# Patient Record
Sex: Male | Born: 1980 | Race: Black or African American | Hispanic: No | Marital: Single | Smoking: Current every day smoker
Health system: Southern US, Community
[De-identification: ages and names within clinical notes are randomized; demographics above are authoritative.]

---

## 2003-09-17 ENCOUNTER — Emergency Department (HOSPITAL_COMMUNITY): Admission: EM | Admit: 2003-09-17 | Discharge: 2003-09-17 | Payer: Self-pay | Admitting: *Deleted

## 2003-11-11 ENCOUNTER — Emergency Department (HOSPITAL_COMMUNITY): Admission: EM | Admit: 2003-11-11 | Discharge: 2003-11-11 | Payer: Self-pay | Admitting: *Deleted

## 2004-08-31 ENCOUNTER — Emergency Department (HOSPITAL_COMMUNITY): Admission: EM | Admit: 2004-08-31 | Discharge: 2004-08-31 | Payer: Self-pay | Admitting: Emergency Medicine

## 2004-09-14 ENCOUNTER — Emergency Department (HOSPITAL_COMMUNITY): Admission: EM | Admit: 2004-09-14 | Discharge: 2004-09-14 | Payer: Self-pay | Admitting: Emergency Medicine

## 2004-12-01 ENCOUNTER — Emergency Department (HOSPITAL_COMMUNITY): Admission: EM | Admit: 2004-12-01 | Discharge: 2004-12-01 | Payer: Self-pay | Admitting: Emergency Medicine

## 2005-02-11 ENCOUNTER — Emergency Department (HOSPITAL_COMMUNITY): Admission: EM | Admit: 2005-02-11 | Discharge: 2005-02-11 | Payer: Self-pay | Admitting: Emergency Medicine

## 2005-02-16 ENCOUNTER — Emergency Department (HOSPITAL_COMMUNITY): Admission: EM | Admit: 2005-02-16 | Discharge: 2005-02-16 | Payer: Self-pay | Admitting: Emergency Medicine

## 2011-12-28 ENCOUNTER — Encounter (HOSPITAL_COMMUNITY): Payer: Self-pay | Admitting: *Deleted

## 2011-12-28 ENCOUNTER — Emergency Department (HOSPITAL_COMMUNITY)
Admission: EM | Admit: 2011-12-28 | Discharge: 2011-12-28 | Disposition: A | Discharge status: Home / Self Care | Payer: Self-pay | Attending: Emergency Medicine | Admitting: Emergency Medicine

## 2011-12-28 DIAGNOSIS — F17200 Nicotine dependence, unspecified, uncomplicated: Secondary | ICD-10-CM | POA: Insufficient documentation

## 2011-12-28 DIAGNOSIS — J019 Acute sinusitis, unspecified: Secondary | ICD-10-CM | POA: Insufficient documentation

## 2011-12-28 MED ORDER — FLUTICASONE PROPIONATE 50 MCG/ACT NA SUSP: 2.0000 | Freq: Every day | NASAL | Status: DC

## 2011-12-28 MED ORDER — AMOXICILLIN-POT CLAVULANATE 875-125 MG PO TABS: 1.0000 | ORAL_TABLET | Freq: Two times a day (BID) | ORAL | Status: AC

## 2011-12-28 NOTE — Discharge Instructions (Signed)
Sinusitis Sinuses are air pockets within the bones of your face. The growth of bacteria within a sinus leads to infection. The infection prevents the sinuses from draining. This infection is called sinusitis. SYMPTOMS  There will be different areas of pain depending on which sinuses have become infected.  The maxillary sinuses often produce pain beneath the eyes.   Frontal sinusitis may cause pain in the middle of the forehead and above the eyes.  Other problems (symptoms) include:  Toothaches.   Colored, pus-like (purulent) drainage from the nose.   Swelling, warmth, and tenderness over the sinus areas may be signs of infection.  TREATMENT  Sinusitis is most often determined by an exam.X-rays may be taken. If x-rays have been taken, make sure you obtain your results or find out how you are to obtain them. Your caregiver may give you medications (antibiotics). These are medications that will help kill the bacteria causing the infection. You may also be given a medication (decongestant) that helps to reduce sinus swelling.  HOME CARE INSTRUCTIONS   Only take over-the-counter or prescription medicines for pain, discomfort, or fever as directed by your caregiver.   Drink extra fluids. Fluids help thin the mucus so your sinuses can drain more easily.   Applying either moist heat or ice packs to the sinus areas may help relieve discomfort.   Use saline nasal sprays to help moisten your sinuses. The sprays can be found at your local drugstore.  SEEK IMMEDIATE MEDICAL CARE IF:  You have a fever.   You have increasing pain, severe headaches, or toothache.   You have nausea, vomiting, or drowsiness.   You develop unusual swelling around the face or trouble seeing.  MAKE SURE YOU:   Understand these instructions.   Will watch your condition.   Will get help right away if you are not doing well or get worse.  Document Released: 09/12/2005 Document Revised: 09/01/2011 Document Reviewed:  04/11/2007 [facility] Patient Information 2012 [place], [place].   Take the meds as directed.  Take tylenol or ibuprofen if needed for pain or fever.

## 2011-12-28 NOTE — ED Provider Notes (Signed)
Medical screening examination/treatment/procedure(s) were performed by non-physician practitioner and as supervising physician I was immediately available for consultation/collaboration.  Hurman Horn, MD 12/28/11 2157

## 2011-12-28 NOTE — ED Provider Notes (Signed)
History     CSN: [id number]  Arrival date & time 12/28/11  2956   First MD Initiated Contact with Patient 12/28/11 [phone]      Chief Complaint  Patient presents with  . Nasal Congestion    (Consider location/radiation/quality/duration/timing/severity/associated sxs/prior treatment) HPI Comments: Nasal congestion chronically but worse in past few days with sinus pressure and thick, gray nasal d/c.  Blood streaks in d/c.  No PCP.  No cough.  No fever.  The history is provided by the patient. No language interpreter was used.    History reviewed. No pertinent past medical history.  History reviewed. No pertinent past surgical history.  No family history on file.  History  Substance Use Topics  . Smoking status: Current Everyday Smoker  . Smokeless tobacco: Not on file  . Alcohol Use: Yes     OCC      Review of Systems  Constitutional: Negative for fever and chills.  HENT: Positive for congestion and sinus pressure.   Respiratory: Negative for cough.   All other systems reviewed and are negative.    Allergies  Review of patient's allergies indicates no known allergies.  Home Medications   Current Outpatient Rx  Name Route Sig Dispense Refill  . AMOXICILLIN-POT CLAVULANATE 875-125 MG PO TABS Oral Take 1 tablet by mouth 2 (two) times daily. 14 tablet 0  . FLUTICASONE PROPIONATE 50 MCG/ACT NA SUSP Nasal Place 2 sprays into the nose daily. 16 g 0    BP 108/71  Pulse 73  Temp(Src) 97.9 F (36.6 C) (Oral)  Resp 16  SpO2 100%  Physical Exam  Nursing note and vitals reviewed. Constitutional: He is oriented to person, place, and time. He appears well-developed and well-nourished.  HENT:  Head: Normocephalic and atraumatic.    Eyes: EOM are normal.  Neck: Normal range of motion.  Cardiovascular: Normal rate, regular rhythm, normal heart sounds and intact distal pulses.   Pulmonary/Chest: Effort normal and breath sounds normal. No accessory muscle usage. Not  tachypneic. No respiratory distress. He has no decreased breath sounds. He has no wheezes. He has no rhonchi. He has no rales.  Abdominal: Soft. He exhibits no distension. There is no tenderness.  Musculoskeletal: Normal range of motion.  Lymphadenopathy:    He has no cervical adenopathy.  Neurological: He is alert and oriented to person, place, and time.  Skin: Skin is warm and dry.  Psychiatric: He has a normal mood and affect. Judgment normal.    ED Course  Procedures (including critical care time)  Labs Reviewed - No data to display No results found.   1. Sinusitis, acute       MDM  rx- augmentin, BID, 14 rx flonase  Nasal spray        Worthy Rancher, PA 12/28/11 0945  Worthy Rancher, PA 12/28/11 [phone]

## 2011-12-28 NOTE — ED Notes (Signed)
"  Sinuses are bothering me, my nose is almost swollen shut and have noticed some bleeding from the nose."

## 2015-02-17 ENCOUNTER — Encounter (HOSPITAL_COMMUNITY): Payer: Self-pay | Admitting: Cardiology

## 2015-02-17 ENCOUNTER — Emergency Department (HOSPITAL_COMMUNITY)
Admission: EM | Admit: 2015-02-17 | Discharge: 2015-02-17 | Disposition: A | Discharge status: Home / Self Care | Payer: Self-pay | Attending: Emergency Medicine | Admitting: Emergency Medicine

## 2015-02-17 DIAGNOSIS — Z7951 Long term (current) use of inhaled steroids: Secondary | ICD-10-CM | POA: Insufficient documentation

## 2015-02-17 DIAGNOSIS — Z720 Tobacco use: Secondary | ICD-10-CM | POA: Insufficient documentation

## 2015-02-17 DIAGNOSIS — K088 Other specified disorders of teeth and supporting structures: Secondary | ICD-10-CM | POA: Insufficient documentation

## 2015-02-17 DIAGNOSIS — K0889 Other specified disorders of teeth and supporting structures: Secondary | ICD-10-CM

## 2015-02-17 MED ORDER — NAPROXEN 500 MG PO TABS: 500.0000 mg | ORAL_TABLET | Freq: Two times a day (BID) | ORAL | Status: DC

## 2015-02-17 MED ORDER — IBUPROFEN 400 MG PO TABS
600.0000 mg | ORAL_TABLET | Freq: Once | ORAL | Status: AC
Start: 2015-02-17 — End: 2015-02-17
  Administered 2015-02-17: 600 mg via ORAL
  Filled 2015-02-17: qty 2

## 2015-02-17 MED ORDER — PENICILLIN V POTASSIUM 500 MG PO TABS: 500.0000 mg | ORAL_TABLET | Freq: Three times a day (TID) | ORAL | Status: DC

## 2015-02-17 NOTE — ED Provider Notes (Signed)
CSN: [id number]     Arrival date & time 02/17/15  8295 History  This chart was scribed for Caleb Ohara, MD by Leona Carry, ED Scribe. The patient was seen in [facility]. The patient's care was started at 9:19 AM.     Chief Complaint  Patient presents with  . Dental Pain   Patient is a 34 y.o. male presenting with tooth pain. The history is provided by the patient. No language interpreter was used.  Dental Pain  HPI Comments: Caleb Rose is a 34 y.o. male who presents to the Emergency Department complaining of intermittent dental pain beginning approximately one month ago. He reports that the pain has become progressively worse in the past 3 days. Patient states that he has experienced pain while eating and reports that his teeth are sensitive to cold. He has taken Flonase for sinus pain but denies taking antibiotics.     History reviewed. No pertinent past medical history. History reviewed. No pertinent past surgical history. History reviewed. No pertinent family history. History  Substance Use Topics  . Smoking status: Current Every Day Smoker  . Smokeless tobacco: Not on file  . Alcohol Use: Yes     Comment: OCC    Review of Systems  HENT: Positive for dental problem.       Allergies  Review of patient's allergies indicates no known allergies.  Home Medications   Prior to Admission medications   Medication Sig Start Date End Date Taking? Authorizing Provider  fluticasone (FLONASE) 50 MCG/ACT nasal spray Place 2 sprays into the nose daily. 12/28/11 12/27/12  Richard Paul Half, PA-C  naproxen (NAPROSYN) 500 MG tablet Take 1 tablet (500 mg total) by mouth 2 (two) times daily. 02/17/15   Caleb Ohara, MD  penicillin v potassium (VEETID) 500 MG tablet Take 1 tablet (500 mg total) by mouth 3 (three) times daily. 02/17/15   Caleb Ohara, MD   Triage Vitals: BP 111/66 mmHg  Pulse 50  Temp(Src) 97.7 F (36.5 C) (Oral)  Resp 16  Ht  (1.803 m)  Wt 150 lb (68.04  kg)  BMI 20.93 kg/m2  SpO2 100% Physical Exam  Constitutional: He is oriented to person, place, and time. He appears well-developed and well-nourished. No distress.  Overall well-appearing.  HENT:  Head: Normocephalic and atraumatic.  Tenderness to posterior aspect of the left lower molar. Previous filling. No external swelling. No abscess appreciated. No trismus.   Eyes: Conjunctivae and EOM are normal.  Neck: Neck supple. No tracheal deviation present.  Cardiovascular: Normal rate.   Pulmonary/Chest: Effort normal. No respiratory distress.  Musculoskeletal: Normal range of motion.  Neurological: He is alert and oriented to person, place, and time.  Skin: Skin is warm and dry.  Psychiatric: He has a normal mood and affect. His behavior is normal.  Nursing note and vitals reviewed.   ED Course  Procedures (including critical care time) DIAGNOSTIC STUDIES: Oxygen Saturation is 100% on room air, normal by my interpretation.    COORDINATION OF CARE:    Labs Review Labs Reviewed - No data to display  Imaging Review No results found.   EKG Interpretation None      MDM   Final diagnoses:  Pain, dental   Well appearing, no red flags.  Results and differential diagnosis were discussed with the patient/parent/guardian. Close follow up outpatient was discussed, comfortable with the plan.   Medications  ibuprofen (ADVIL,MOTRIN) tablet 600 mg (not administered)    Filed Vitals:   02/17/15  0758 02/17/15 0802  BP: 111/66   Pulse: 50   Temp: 97.7 F (36.5 C)   TempSrc: Oral   Resp: 16   Height: 5\' 11"  (1.803 m)   Weight: 150 lb (68.04 kg)   SpO2: 100% 100%    Final diagnoses:  Pain, dental      Caleb OharaJoshua Emari Demmer, MD 02/17/15 [phone]

## 2015-02-17 NOTE — ED Notes (Signed)
Dental pain times one month.  Worse today.

## 2015-02-17 NOTE — Discharge Instructions (Signed)
Take antibiotics as directed, please see a dentist.  Take ibuprofen every 6 hrs and tylenol every 4 hrs for pain.  If you were given medicines take as directed.  If you are on coumadin or contraceptives realize their levels and effectiveness is altered by many different medicines.  If you have any reaction (rash, tongues swelling, other) to the medicines stop taking and see a physician.    If your blood pressure was elevated in the ER make sure you follow up for management with a primary doctor or return for chest pain, shortness of breath or stroke symptoms.  Please follow up as directed and return to the ER or see a physician for new or worsening symptoms.  Thank you. Filed Vitals:   02/17/15 0758 02/17/15 0802  BP: 111/66   Pulse: 50   Temp: 97.7 F (36.5 C)   TempSrc: Oral   Resp: 16   Height:  (1.803 m)   Weight: 150 lb (68.04 kg)   SpO2: 100% 100%    Emergency Department Resource Guide 1) Find a Doctor and Pay Out of Pocket Although you won't have to find out who is covered by your insurance plan, it is a good idea to ask around and get recommendations. You will then need to call the office and see if the doctor you have chosen will accept you as a new patient and what types of options they offer for patients who are self-pay. Some doctors offer discounts or will set up payment plans for their patients who do not have insurance, but you will need to ask so you aren't surprised when you get to your appointment.  2) Contact Your Local Health Department Not all health departments have doctors that can see patients for sick visits, but many do, so it is worth a call to see if yours does. If you don't know where your local health department is, you can check in your phone book. The CDC also has a tool to help you locate your state's health department, and many state websites also have listings of all of their local health departments.  3) Find a Walk-in Clinic If your illness is  not likely to be very severe or complicated, you may want to try a walk in clinic. These are popping up all over the country in pharmacies, drugstores, and shopping centers. They're usually staffed by nurse practitioners or physician assistants that have been trained to treat common illnesses and complaints. They're usually fairly quick and inexpensive. However, if you have serious medical issues or chronic medical problems, these are probably not your best option.  No Primary Care Doctor: - Call Health Connect at  [phone] - they can help you locate a primary care doctor that  accepts your insurance, provides certain services, etc. - Physician Referral Service- [phone]  Chronic Pain Problems: Organization         Address  Phone   Notes  [facility]  [phone] Patients need to be referred by their primary care doctor.   Medication Assistance: Organization         Address  Phone   Notes  [facility] Medication [facility] [address] [place], [place] [address] [phone] --Must be a resident of [facility] -- Must have NO insurance coverage whatsoever (no Medicaid/ Medicare, etc.) -- The pt. MUST have a primary care doctor that directs their care regularly and follows them in the community   MedAssist  (  [phone]   [employer]  [phone]    Agencies that provide inexpensive medical care: Organization         Address  Phone   Notes  [facility]  [phone]   [facility]    [phone]   [facility] [address] [place], [place] [address] [phone]   [facility] [address] [place] [place] [phone]   Planned Parenthood    [phone]   [facility]    [phone]   [facility]  [address], [place] Phone:  [phone], Fax:  [phone] Hours of Operation:  9 am - 6  pm, M-F.  Also accepts Medicaid/Medicare and self-pay.  [facility]  [address], [place] Phone: [phone], Fax: [phone]. Hours of Operation:  8:30 am - 5:30 pm, M-F.  Also accepts Medicaid and self-pay.  [facility] [place] [address] [place] Phone: [phone]   [facility] [address] Natasha Bence [place], [place] [phone], Ext. [phone] Mondays & Thursdays: 7-9 AM.  First 15 patients are seen on a first come, first serve basis.    Medicaid-accepting [employer] Providers:  Organization         Address  Phone   Notes  [facility] [address], [place] [phone] Also accepts self-pay patients.  [facility] [address] Laurell Josephs [place], [place]  [phone]   [facility] [address], [place] [phone]   [facility] [address] [place] [phone]   Renaye Rakers [address], [place]   [phone] Only accepts [place] patients after they have their name applied to their card.   Self-Pay (no insurance) in [facility]:  Organization         Address  Phone   Notes  Sickle Cell Patients, [facility] [address] [place], [place] [phone]   [facility] [address] [place], [place] [phone]   [facility] [place]  [address], [place] [phone]   [facility]/Dr. Osei-Bonsu  [address] [place] or [address], [place] [phone] Phone number for both [place] and [place] locations is the same.  [facility] [address], [place] [phone]   [facility] [address], [place] or [address] [phone]   [facility] [address], [place] [phone], phone; [phone], fax Sees patients 1st and 3rd Saturday of every month.  Must not qualify for public or private insurance (i.e. Medicaid, Medicare, [place] Health Choice, Veterans' Benefits)  Household income should be no more than 200% of the poverty level The clinic cannot treat you if you are pregnant or think you are pregnant  Sexually transmitted diseases are not treated at the clinic.    Dental Care: Organization         Address  Phone  Notes  [facility] Department of [facility] [address] [place], [place] [phone] Accepts children up to age 66 who are enrolled in [place] or [place] Health Choice; pregnant women  with a Medicaid card; and children who have applied for Medicaid or [place] Health Choice, but were declined, whose parents can pay a reduced fee at time of service.  [facility] of [facility] [place]  [address], [place] [phone] Accepts children up to age 33 who are enrolled in [place] or [place] Health Choice; pregnant women with a Medicaid card; and children who have applied for Medicaid or [place] Health Choice, but were declined, whose parents can pay a reduced fee at time of service.  [facility]  [address], [place] [phone] Patients are seen by appointment only. Walk-ins are not accepted. [facility] will see patients 33 years of age and older. Monday - Tuesday (8am-5pm) Most Wednesdays (8:30-5pm) $30 per visit, cash only  [facility]  [address], [facility] [phone] Patients are seen by appointment only. Walk-ins are not accepted. [facility] will see patients 33 years of age and older. One Wednesday Evening (Monthly: Volunteer Based).  $30 per visit, cash only  [employer] [facility]  [phone]) [phone] for adults; Children under age 684, call Graduate Pediatric Dentistry at [phone])  [phone]. Children aged 564-14, please call [phone]) [phone] to request a pediatric application.  Dental services are provided in all areas of dental care including fillings, crowns and bridges, complete and partial dentures, implants, gum treatment, root canals, and extractions. Preventive care is also provided. Treatment is provided to both adults and children. Patients are selected via a lottery and there is often a waiting list.   [facility] [address], [place]  [phone]) [phone] [email or web address]   [facility] [address], [place], [place] [phone], Ext. [phone] Second and Fourth Thursday of each month, opens at 6:30 AM; Clinic ends at 9 AM.  Patients are seen on a first-come first-served basis, and a limited number are seen during each clinic.   [facility]  [address], [place], [place] [phone]   Eligibility Requirements You must have lived in [place], [place], or [place] counties for at least the last three months.   You cannot be eligible for state or federal sponsored National Cityhealthcare insurance, including CIGNAVeterans Administration, [place]icaid, or [employer]icare.   You generally cannot be eligible for healthcare insurance through your employer.    How to apply: Eligibility screenings are held every Tuesday and Wednesday afternoon from 1:00 pm until 4:00 pm. You do not need an appointment for the interview!  [facility] [address], [place], [place] [phone]   [facility]  [phone]   [facility]  [phone]   [facility]  [phone]    Behavioral Health Resources in the Community: Intensive Outpatient Programs Organization         Address  Phone  Notes  [facility] [address], [place], [place] [phone]   [facility] [address], [place], [place] [phone]   [facility]: [facility] Drug Sv[facility]  [address], [place], [place]  [phone]   [facility] [address],  [place], [place] [phone] or [phone]   Substance Abuse Resources Organization         Address  Phone  Notes  [facility]  [phone]   [facility]  [phone]   [facility]  [phone]   [facility]  [phone]   Residential & Outpatient Substance Abuse Program  [phone]   Psychological Services Organization         Address  Phone  Notes  [facility]  [phone]- [phone]   [facility]  [phone]- [phone]   [facility] [address], [place] [phone] or [phone]    Mobile Crisis Teams Organization         Address  Phone  Notes  [facility], Mobile Crisis Care Unit  [phone]   [facility]  [address] [place], [place] [phone]   Doristine LocksSharon DeEsch [address] [place] [phone]    Self-Help/Support Groups Organization         Address  Phone             Notes  [facility] [place] - variety of support groups  336- [phone] Call for more information  Narcotics Anonymous (NA), [facility] [address], [place]  2 meetings at this location   Statisticianesidential Treatment Programs Organization         Address  Phone  Notes  [facility] [address],    [place]  [phone]   [facility]  [address], [place] [phone], [place], [place] [phone]   [facility] [address], [place] [phone] Admissions: 8am-3pm M-F  [facility] [address]    [place], [place] [phone]   [facility] [address], [place], [place] [phone]   [facility] [address]  [place], [place] [phone]   [facility] - Intensive Outpatient [address], [place], [place]  [phone]   [facility] ([facility] [address]  [place], [place] [phone] or [phone]   [facility] ([facility]) [address] [place], [place] [phone] Accepts Medicaid  [facility] [address]  [place] [phone] Substance Abuse/Addiction Treatment   [facility] Resources Organization         Address  Phone  Notes  [facility]  [phone]) [phone]   Angie FavaJulie Brannon, PhD [address], [address] [place], [place]   [phone] or [phone]   [facility]   [address] [place], [place] [phone](336) [phone]   [facility] [address], [place], [place] [phone]) [phone] Insurance/Medicaid/sponsorship through [facility]  [facility] [address]                                    [place], [place] [phone] Therapy/tele-psych/case  [facility] [address]   [place], [place] [phone](336) [phone]    Dr. Lolly MustacheArfeen  [phone](336) [phone]   [facility]  [employer] [facility] 1) [address], [place] 2) [address], [place] 3)  [address], [place] [phone]  [phone]) [phone]   [facility] Child Abuse Hotline [phone]) [phone] or [phone]) [phone] (After Hours)

## 2015-02-17 NOTE — ED Notes (Signed)
PT c/o left lower dental pain x1 month and states worsening today.

## 2017-11-18 ENCOUNTER — Emergency Department (HOSPITAL_COMMUNITY)
Admission: EM | Admit: 2017-11-18 | Discharge: 2017-11-18 | Disposition: A | Discharge status: Home / Self Care | Payer: Self-pay | Attending: Emergency Medicine | Admitting: Emergency Medicine

## 2017-11-18 ENCOUNTER — Encounter (HOSPITAL_COMMUNITY): Payer: Self-pay | Admitting: *Deleted

## 2017-11-18 DIAGNOSIS — K0889 Other specified disorders of teeth and supporting structures: Secondary | ICD-10-CM

## 2017-11-18 DIAGNOSIS — Z79899 Other long term (current) drug therapy: Secondary | ICD-10-CM | POA: Insufficient documentation

## 2017-11-18 DIAGNOSIS — K029 Dental caries, unspecified: Secondary | ICD-10-CM | POA: Insufficient documentation

## 2017-11-18 DIAGNOSIS — F17200 Nicotine dependence, unspecified, uncomplicated: Secondary | ICD-10-CM | POA: Insufficient documentation

## 2017-11-18 MED ORDER — AMOXICILLIN 500 MG PO CAPS: 500.0000 mg | ORAL_CAPSULE | Freq: Three times a day (TID) | ORAL | 0 refills | Status: DC

## 2017-11-18 MED ORDER — AMOXICILLIN 250 MG PO CAPS
500.0000 mg | ORAL_CAPSULE | Freq: Once | ORAL | Status: AC
  Administered 2017-11-18: 500 mg via ORAL
  Filled 2017-11-18: qty 2

## 2017-11-18 MED ORDER — IBUPROFEN 800 MG PO TABS
800.0000 mg | ORAL_TABLET | Freq: Once | ORAL | Status: AC
  Administered 2017-11-18: 800 mg via ORAL
  Filled 2017-11-18: qty 1

## 2017-11-18 MED ORDER — IBUPROFEN 600 MG PO TABS: 600.0000 mg | ORAL_TABLET | Freq: Four times a day (QID) | ORAL | 0 refills | Status: DC

## 2017-11-18 NOTE — ED Notes (Signed)
Pt admits to vomiting x 1 around 0300 this morning, denies nausea at present.

## 2017-11-18 NOTE — ED Triage Notes (Signed)
Pt with left dental pain ongoing for over a week.

## 2017-11-18 NOTE — Discharge Instructions (Signed)
Your vital signs are within normal limits.  You have swelling of the lower gum on the left.  Please use Amoxil 3 times daily for infection.  Please use ibuprofen 4 times daily with meals and at bedtime for swelling and pain.  It is important that you see a dentist as soon as possible.

## 2017-11-18 NOTE — ED Notes (Signed)
Pt reports a bad bottom tooth  Reports has a filling in the tooth but painful since last thursday

## 2017-11-18 NOTE — ED Notes (Signed)
Awaiting eval  

## 2017-11-18 NOTE — ED Provider Notes (Signed)
[facility] Provider Note   CSN: [id number] Arrival date & time: 11/18/17  1210     History   Chief Complaint Chief Complaint  Patient presents with  . Dental Pain    HPI Caleb Rose is a 37 y.o. male.  No difficulty with swallowing or speaking.   The history is provided by the patient.  Dental Pain   This is a new problem. The current episode started more than 1 week ago. The problem occurs daily. The problem has been gradually worsening. The pain is moderate. He has tried nothing for the symptoms. The treatment provided no relief.    History reviewed. No pertinent past medical history.  There are no active problems to display for this patient.   History reviewed. No pertinent surgical history.     Home Medications    Prior to Admission medications   Medication Sig Start Date End Date Taking? Authorizing Provider  fluticasone (FLONASE) 50 MCG/ACT nasal spray Place 2 sprays into the nose daily. 12/28/11 12/27/12  Worthy RancherMiller, Richard M, PA-C  naproxen (NAPROSYN) 500 MG tablet Take 1 tablet (500 mg total) by mouth 2 (two) times daily. 02/17/15   Blane OharaZavitz, Joshua, MD  penicillin v potassium (VEETID) 500 MG tablet Take 1 tablet (500 mg total) by mouth 3 (three) times daily. 02/17/15   Blane OharaZavitz, Joshua, MD    Family History History reviewed. No pertinent family history.  Social History Social History   Tobacco Use  . Smoking status: Current Every Day Smoker  . Smokeless tobacco: Never Used  Substance Use Topics  . Alcohol use: Yes    Comment: OCC  . Drug use: No     Allergies   Patient has no known allergies.   Review of Systems Review of Systems  Constitutional: Negative for activity change.       All ROS Neg except as noted in HPI  HENT: Positive for dental problem. Negative for nosebleeds.   Eyes: Negative for photophobia and discharge.  Respiratory: Negative for cough, shortness of breath and wheezing.   Cardiovascular: Negative for  chest pain and palpitations.  Gastrointestinal: Negative for abdominal pain and blood in stool.  Genitourinary: Negative for dysuria, frequency and hematuria.  Musculoskeletal: Negative for arthralgias, back pain and neck pain.  Skin: Negative.   Neurological: Negative for dizziness, seizures and speech difficulty.  Psychiatric/Behavioral: Negative for confusion and hallucinations.     Physical Exam Updated Vital Signs BP 118/74 (BP Location: Right Arm)   Pulse 93   Temp 98.5 F (36.9 C) (Oral)   Resp 16   Ht 5\' 10"  (1.778 m)   Wt 72.6 kg (160 lb)   SpO2 100%   BMI 22.96 kg/m   Physical Exam  Constitutional: He is oriented to person, place, and time. He appears well-developed and well-nourished.  Non-toxic appearance.  HENT:  Head: Normocephalic.  Right Ear: Tympanic membrane and external ear normal.  Left Ear: Tympanic membrane and external ear normal.  Dental caries left lower molar area. Some swelling of the gum. No visible abscess. Airway patent. Uvula midline.  Eyes: EOM and lids are normal. Pupils are equal, round, and reactive to light.  Neck: Normal range of motion. Neck supple. Carotid bruit is not present.  Cardiovascular: Normal rate, regular rhythm, normal heart sounds, intact distal pulses and normal pulses.  Pulmonary/Chest: Breath sounds normal. No respiratory distress.  Abdominal: Soft. Bowel sounds are normal. There is no tenderness. There is no guarding.  Musculoskeletal: Normal range of  motion.  Lymphadenopathy:       Head (right side): No submandibular adenopathy present.       Head (left side): No submandibular adenopathy present.    He has no cervical adenopathy.  Neurological: He is alert and oriented to person, place, and time. He has normal strength. No cranial nerve deficit or sensory deficit.  Skin: Skin is warm and dry.  Psychiatric: He has a normal mood and affect. His speech is normal.  Nursing note and vitals reviewed.    ED Treatments /  Results  Labs (all labs ordered are listed, but only abnormal results are displayed) Labs Reviewed - No data to display  EKG  EKG Interpretation None       Radiology No results found.  Procedures Procedures (including critical care time)  Medications Ordered in ED Medications - No data to display   Initial Impression / Assessment and Plan / ED Course  I have reviewed the triage vital signs and the nursing notes.  Pertinent labs & imaging results that were available during my care of the patient were reviewed by me and considered in my medical decision making (see chart for details).       Final Clinical Impressions(s) / ED Diagnoses MDM  Patient has swelling of the left lower gum area.  There is no visible abscess appreciated.  Airway is patent, there is no evidence for Ludewig's angina or other emergent changes..  Patient will be treated with Amoxil and ibuprofen.  He is advised to see his dentist as soon as possible for dental management of this issue.   Final diagnoses:  Dental caries  Pain, dental    ED Discharge Orders        Ordered    amoxicillin (AMOXIL) 500 MG capsule  3 times daily     11/18/17 1357    ibuprofen (ADVIL,MOTRIN) 600 MG tablet  4 times daily     11/18/17 1357       Ivery Quale, PA-C 11/18/17 1401    Donnetta Hutching, MD 11/19/17 [id number]

## 2018-10-27 ENCOUNTER — Encounter (HOSPITAL_COMMUNITY): Payer: Self-pay | Admitting: *Deleted

## 2018-10-27 ENCOUNTER — Encounter (HOSPITAL_COMMUNITY): Payer: Self-pay

## 2018-10-27 ENCOUNTER — Emergency Department (HOSPITAL_COMMUNITY)
Admission: EM | Admit: 2018-10-27 | Discharge: 2018-10-27 | Disposition: A | Discharge status: Other Acute Inpatient Hospital | Payer: Medicaid Other | Attending: Emergency Medicine | Admitting: Emergency Medicine

## 2018-10-27 ENCOUNTER — Other Ambulatory Visit: Payer: Self-pay

## 2018-10-27 ENCOUNTER — Inpatient Hospital Stay (HOSPITAL_COMMUNITY)
Admission: AD | Admit: 2018-10-27 | Discharge: 2018-10-30 | DRG: 885 | Disposition: A | Discharge status: Home / Self Care | Payer: Medicaid Other | Source: Intra-hospital | Attending: Psychiatry | Admitting: Psychiatry

## 2018-10-27 DIAGNOSIS — Z79899 Other long term (current) drug therapy: Secondary | ICD-10-CM | POA: Diagnosis not present

## 2018-10-27 DIAGNOSIS — R45851 Suicidal ideations: Secondary | ICD-10-CM | POA: Diagnosis present

## 2018-10-27 DIAGNOSIS — F1020 Alcohol dependence, uncomplicated: Secondary | ICD-10-CM | POA: Insufficient documentation

## 2018-10-27 DIAGNOSIS — Y900 Blood alcohol level of less than 20 mg/100 ml: Secondary | ICD-10-CM | POA: Diagnosis present

## 2018-10-27 DIAGNOSIS — F17210 Nicotine dependence, cigarettes, uncomplicated: Secondary | ICD-10-CM | POA: Insufficient documentation

## 2018-10-27 DIAGNOSIS — F1220 Cannabis dependence, uncomplicated: Secondary | ICD-10-CM | POA: Insufficient documentation

## 2018-10-27 DIAGNOSIS — F1010 Alcohol abuse, uncomplicated: Secondary | ICD-10-CM | POA: Diagnosis present

## 2018-10-27 DIAGNOSIS — Z818 Family history of other mental and behavioral disorders: Secondary | ICD-10-CM

## 2018-10-27 DIAGNOSIS — F1290 Cannabis use, unspecified, uncomplicated: Secondary | ICD-10-CM | POA: Diagnosis present

## 2018-10-27 DIAGNOSIS — F22 Delusional disorders: Secondary | ICD-10-CM | POA: Diagnosis present

## 2018-10-27 DIAGNOSIS — F322 Major depressive disorder, single episode, severe without psychotic features: Principal | ICD-10-CM | POA: Diagnosis present

## 2018-10-27 DIAGNOSIS — F99 Mental disorder, not otherwise specified: Secondary | ICD-10-CM | POA: Diagnosis present

## 2018-10-27 DIAGNOSIS — F329 Major depressive disorder, single episode, unspecified: Secondary | ICD-10-CM | POA: Diagnosis present

## 2018-10-27 LAB — COMPREHENSIVE METABOLIC PANEL
ALT: 21 U/L (ref 0–44)
AST: 22 U/L (ref 15–41)
Albumin: 4.6 g/dL (ref 3.5–5.0)
Alkaline Phosphatase: 54 U/L (ref 38–126)
Anion gap: 9 (ref 5–15)
BUN: 15 mg/dL (ref 6–20)
CO2: 24 mmol/L (ref 22–32)
Calcium: 9.5 mg/dL (ref 8.9–10.3)
Chloride: 106 mmol/L (ref 98–111)
Creatinine, Ser: 0.9 mg/dL (ref 0.61–1.24)
GFR calc Af Amer: >60 mL/min (ref >60)
GFR calc non Af Amer: >60 mL/min (ref >60)
Glucose, Bld: 87 mg/dL (ref 70–99)
Potassium: 3.8 mmol/L (ref 3.5–5.1)
Sodium: 139 mmol/L (ref 135–145)
Total Bilirubin: 0.6 mg/dL (ref 0.3–1.2)
Total Protein: 7 g/dL (ref 6.5–8.1)

## 2018-10-27 LAB — ACETAMINOPHEN LEVEL: Acetaminophen (Tylenol), Serum: <10 ug/mL — ABNORMAL LOW (ref 10–30)

## 2018-10-27 LAB — RAPID URINE DRUG SCREEN, HOSP PERFORMED
Amphetamines: NOT DETECTED
Barbiturates: NOT DETECTED
Benzodiazepines: NOT DETECTED
Cocaine: NOT DETECTED
Opiates: NOT DETECTED
Tetrahydrocannabinol: POSITIVE — AB

## 2018-10-27 LAB — CBC WITH DIFFERENTIAL/PLATELET
Abs Immature Granulocytes: 0.04 10*3/uL (ref 0.00–0.07)
Basophils Absolute: 0 10*3/uL (ref 0.0–0.1)
Basophils Relative: 0 %
Eosinophils Absolute: 0 10*3/uL (ref 0.0–0.5)
Eosinophils Relative: 0 %
HCT: 43.4 % (ref 39.0–52.0)
Hemoglobin: 14.7 g/dL (ref 13.0–17.0)
Immature Granulocytes: 0 %
Lymphocytes Relative: 9 %
Lymphs Abs: 1 10*3/uL (ref 0.7–4.0)
MCH: 30.2 pg (ref 26.0–34.0)
MCHC: 33.9 g/dL (ref 30.0–36.0)
MCV: 89.3 fL (ref 80.0–100.0)
Monocytes Absolute: 0.5 10*3/uL (ref 0.1–1.0)
Monocytes Relative: 5 %
Neutro Abs: 9.6 10*3/uL — ABNORMAL HIGH (ref 1.7–7.7)
Neutrophils Relative %: 86 %
Platelets: 174 10*3/uL (ref 150–400)
RBC: 4.86 MIL/uL (ref 4.22–5.81)
RDW: 12.3 % (ref 11.5–15.5)
WBC: 11.2 10*3/uL — ABNORMAL HIGH (ref 4.0–10.5)
nRBC: 0 % (ref 0.0–0.2)

## 2018-10-27 LAB — ETHANOL: Alcohol, Ethyl (B): 12 mg/dL — ABNORMAL HIGH (ref <10)

## 2018-10-27 LAB — SALICYLATE LEVEL: Salicylate Lvl: <7 mg/dL (ref 2.8–30.0)

## 2018-10-27 MED ORDER — ENSURE ENLIVE PO LIQD
237.0000 mL | Freq: Two times a day (BID) | ORAL | Status: DC
  Administered 2018-10-28 – 2018-10-30 (×5): 237 mL via ORAL

## 2018-10-27 MED ORDER — TRAZODONE HCL 50 MG PO TABS: 50.0000 mg | ORAL_TABLET | Freq: Every evening | ORAL | Status: DC | PRN

## 2018-10-27 MED ORDER — NICOTINE POLACRILEX 2 MG MT GUM: 2.0000 mg | CHEWING_GUM | OROMUCOSAL | Status: DC | PRN

## 2018-10-27 MED ORDER — HYDROXYZINE HCL 25 MG PO TABS
25.0000 mg | ORAL_TABLET | Freq: Three times a day (TID) | ORAL | Status: DC | PRN
  Administered 2018-10-28 – 2018-10-29 (×3): 25 mg via ORAL
  Filled 2018-10-27: qty 10
  Filled 2018-10-27 (×3): qty 1

## 2018-10-27 MED ORDER — ACETAMINOPHEN 325 MG PO TABS
650.0000 mg | ORAL_TABLET | Freq: Four times a day (QID) | ORAL | Status: DC | PRN
  Administered 2018-10-30: 650 mg via ORAL
  Filled 2018-10-27: qty 2

## 2018-10-27 MED ORDER — MAGNESIUM HYDROXIDE 400 MG/5ML PO SUSP: 30.0000 mL | Freq: Every day | ORAL | Status: DC | PRN

## 2018-10-27 MED ORDER — ALUM & MAG HYDROXIDE-SIMETH 200-200-20 MG/5ML PO SUSP: 30.0000 mL | ORAL | Status: DC | PRN

## 2018-10-27 NOTE — ED Triage Notes (Addendum)
Pt feels that he has anxiety and is depressed and feels he needs to talk with someone. Also reports having a lot of built up anger inside. Pt denies thought of hurting himself or anyone else. Pt has been wanded by security before triage.

## 2018-10-27 NOTE — ED Notes (Signed)
Patient given meal tray.

## 2018-10-27 NOTE — ED Notes (Signed)
Pt requested money and debit card to be locked up with security. Card and $75 cash were placed in a bag, sealed, signed by patient, security and this tech before being placed in safe.

## 2018-10-27 NOTE — ED Notes (Signed)
Gave pt's sealed bag with patient's money (75 dollars) and pink debit card to Vanice Sarah with [employer].

## 2018-10-27 NOTE — ED Notes (Signed)
Danny with [facility] called and stated patient will be going to [facility] and can go anytime.
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
## 2018-10-27 NOTE — BH Assessment (Signed)
[facility] Assessment Progress Note    Patient has been accepted to [facility] by Reola Calkins, NP and Dr Jola Babinski will be the admitting physician.  Patient has been assigned to Room 303-1.  Report will need to be called to [phone] and arrangements will need to be made for [facility] to transport by [facility].

## 2018-10-27 NOTE — ED Notes (Signed)
[name] (girlfriend) was able to be contacted at this time through pt's daughter's phone. Girlfriend states pt has anger outbursts and increased anxiety worsening recently. [facility] given girlfriend's number she can be reached at and [facility] counselor spoke with pt at this time and pt agrees to stay voluntary and be admitted to inpatient psych.

## 2018-10-27 NOTE — ED Notes (Signed)
Fiance's phone number: [phone].
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
## 2018-10-27 NOTE — Progress Notes (Signed)
Patient meets criteria for inpatient treatment. No appropriate or available beds at [facility]. CSW faxed referrals to the following facilities for review:  [facility]  [facility]  [facility]  [facility]  [facility]  [facility]  [facility]  [facility]  [facility]  [facility]  [facility]  [facility]  [facility]  [facility]  [facility]  [facility]  [facility]  [facility]   TTS will continue to seek bed placement.  Vilma Meckel. Algis Greenhouse, MSW, LCSW Clinical Social Work/Disposition Phone: [phone] Fax: [phone]

## 2018-10-27 NOTE — ED Notes (Signed)
Called [facility] for transport to [facility]. 

## 2018-10-27 NOTE — ED Notes (Signed)
Danny from [facility] called and requested this nurse to as patient for girlfriend's number to call because they were unable to contact her. Patient allowed me to obtain his cell phone from the locker and he found his girlfriend's number on the phone and gave it to me. Called Danny back and gave number to him. Placed patient's cell phone back in his locker.

## 2018-10-27 NOTE — Tx Team (Signed)
Initial Treatment Plan 10/27/2018 11:52 PM Caleb Rose [id number]    PATIENT STRESSORS: Financial difficulties Marital or family conflict Occupational concerns Substance abuse   PATIENT STRENGTHS: Ability for insight Active sense of humor Average or above average intelligence Capable of independent living Communication skills Motivation for treatment/growth Physical Health Supportive family/friends Work skills   PATIENT IDENTIFIED PROBLEMS: Anger management  Substance abuse      "Opening up, stop keeping things inside"  "Anger issues"           DISCHARGE CRITERIA:  Improved stabilization in mood, thinking, and/or behavior Motivation to continue treatment in a less acute level of care Need for constant or close observation no longer present Verbal commitment to aftercare and medication compliance Withdrawal symptoms are absent or subacute and managed without 24-hour nursing intervention  PRELIMINARY DISCHARGE PLAN: Attend 12-step recovery group Outpatient therapy  PATIENT/FAMILY INVOLVEMENT: This treatment plan has been presented to and reviewed with the patient, Caleb Rose.  The patient and family have been given the opportunity to ask questions and make suggestions.  Juliann Pares, RN 10/27/2018, 11:52 PM

## 2018-10-27 NOTE — ED Notes (Signed)
Patient receiving telepsych.

## 2018-10-27 NOTE — ED Notes (Signed)
Attempted to call [facility] regarding inpatient disposition note that was never put in by [facility]. No answer from [facility].
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
## 2018-10-27 NOTE — ED Notes (Signed)
Patient asked to be updated on treatment plan. Told pt that he does not have bed placement yet at this time.
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
## 2018-10-28 DIAGNOSIS — F322 Major depressive disorder, single episode, severe without psychotic features: Principal | ICD-10-CM

## 2018-10-28 MED ORDER — CHLORDIAZEPOXIDE HCL 25 MG PO CAPS: 25.0000 mg | ORAL_CAPSULE | Freq: Four times a day (QID) | ORAL | Status: DC | PRN

## 2018-10-28 MED ORDER — SERTRALINE HCL 25 MG PO TABS
25.0000 mg | ORAL_TABLET | Freq: Every day | ORAL | Status: DC
  Administered 2018-10-28 – 2018-10-29 (×2): 25 mg via ORAL
  Filled 2018-10-28 (×4): qty 1
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
## 2018-10-28 NOTE — Progress Notes (Signed)
D.  Pt pleasant on approach, complaint of some anxiety at situations in his life beyond his control.  Pt was observed in dayroom interacting appropriately with peers.  Pt denies SI/HI/AVH at this time.  A.  Support and encouragement offered, medication given as ordered for anxiety.  R.  Pt remains safe on the unit, will continue to monitor.

## 2018-10-28 NOTE — [facility] Group Notes (Signed)
[facility] LCSW Group Therapy Note  10/28/2018   10:00-11:00AM  Type of Therapy and Topic:  Group Therapy:  Unhealthy versus Healthy Supports, Which Am I?  Participation Level:  Active   Description of Group:  Patients in this group were introduced to the concept that additional supports including self-support are an essential part of recovery.  Initially a discussion was held about the differences between healthy versus unhealthy supports.  Patients were asked to share what unhealthy supports in their lives need to be addressed, as well as what additional healthy supports could be added for greater help in reaching their goals.   A song entitled "My Own Hero" was played and a group discussion ensued in which patients stated they could relate to the song and it inspired them to realize they have be willing to help themselves in order to succeed, because other people cannot achieve sobriety or stability for them.  We discussed adding a variety of healthy supports to address the various needs in patient lives, including becoming more self-supportive.  Therapeutic Goals: 1)  Highlight the differences between healthy and unhealthy supports 2)  Suggest the importance of being a part of one's own support system 2)  Discuss reasons people in one's life may eventually be unable to be continually supportive  3)  Identify the patient's current support system and   4)  elicit commitments to add healthy supports and to become more conscious of being self-supportive   Summary of Patient Progress:  The patient expressed that the unhealthy support which needs to be addressed includes his family of origin which included a mentally ill mother, an unknown father, and family members taking him in who did not want him.  He said he now has his own family that depends on him and needs to do better.  Healthy supports which could be added for increased stability and happiness include old family members that were helpful in the  past such as cousins, as well as being better for himself.  Therapeutic Modalities:   Motivational Interviewing Activity  Lynnell ChadMareida J Grossman-Orr

## 2018-10-28 NOTE — Plan of Care (Signed)
  Problem: Coping: Goal: Ability to verbalize frustrations and anger appropriately will improve Outcome: Progressing Goal: Ability to demonstrate self-control will improve Outcome: Progressing   D: Pt alert and oriented on the unit. Pt engaging with RN staff and other pts. Pt denies SI/HI, A/VH. Pt rated his depression a 7, feelings of hopelessness a 5, and anxiety a 10. Pt reported that he was really anxious when he first arrived at the hospital but other pts made him feel welcome "so I feel much better." Pt's goal for today is "to finally open up about everything I've been holding in." Pt is pleasant and cooperative.  A: Education, support and encouragement provided, q15 minute safety checks remain in effect. Medications administered per MD orders.  R: No reactions/side effects to medicine noted. Pt denies any concerns at this time, and verbally contracts for safety. Pt ambulating on the unit with no issues. Pt remains safe on and off the unit.
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
## 2018-10-28 NOTE — [facility] Group Notes (Signed)
[facility] Group Notes:  (Nursing/MHT/Case Management/Adjunct)  Date:  10/28/2018  Time:  1:53 PM  Type of Therapy:  Psychoeducational Skills  Participation Level:  Active  Participation Quality:  Appropriate  Affect:  Appropriate  Cognitive:  Appropriate  Insight:  Appropriate  Engagement in Group:  Engaged  Modes of Intervention:  Problem-solving  Summary of Progress/Problems: Pt attended Psychoeducational group with the topic healthy support systems.    Jacquelyne Balint Shanta 10/28/2018, 1:53 PM

## 2018-10-28 NOTE — BHH Suicide Risk Assessment (Deleted)
[facility] Discharge Suicide Risk Assessment   Principal Problem: <principal problem not specified> Discharge Diagnoses: Active Problems:   MDD (major depressive disorder), severe (HCC)   Total Time spent with patient: 15 minutes  Musculoskeletal: Strength & Muscle Tone: within normal limits Gait & Station: normal Patient leans: N/A  Psychiatric Specialty Exam: Review of Systems  All other systems reviewed and are negative.   Blood pressure 113/75, pulse 63, temperature 98.6 F (37 C), temperature source Oral, resp. rate 18, height 5\' 9"  (1.753 m), weight 64 kg.Body mass index is 20.82 kg/m.  General Appearance: Casual  Eye Contact::  Good  Speech:  Normal Rate409  Volume:  Normal  Mood:  Euthymic  Affect:  Congruent  Thought Process:  Coherent and Descriptions of Associations: Intact  Orientation:  Full (Time, Place, and Person)  Thought Content:  Logical  Suicidal Thoughts:  No  Homicidal Thoughts:  No  Memory:  Immediate;   Fair Recent;   Fair Remote;   Fair  Judgement:  Intact  Insight:  Fair  Psychomotor Activity:  Normal  Concentration:  Good  Recall:  Good  Fund of Knowledge:Good  Language: Good  Akathisia:  Negative  Handed:  Right  AIMS (if indicated):     Assets:  Communication Skills Desire for Improvement Housing Physical Health Resilience Social Support  Sleep:  Number of Hours: 5.75  Cognition: WNL  ADL's:  Intact   Mental Status Per Nursing Assessment::   On Admission:  Suicidal ideation indicated by others  Demographic Factors:  Male, Adolescent or young adult and Living alone  Loss Factors: Loss of significant relationship  Historical Factors: Impulsivity  Risk Reduction Factors:   Employed  Continued Clinical Symptoms:  Depression:   Impulsivity  Cognitive Features That Contribute To Risk:  None    Suicide Risk:  Minimal: No identifiable suicidal ideation.  Patients presenting with no risk factors but with morbid ruminations;  may be classified as minimal risk based on the severity of the depressive symptoms    Plan Of Care/Follow-up recommendations:  Activity:  ad lib  Antonieta Pert, MD 10/28/2018, 9:03 AM
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
## 2018-10-28 NOTE — Progress Notes (Signed)
Nutrition Brief Note  Patient identified on the Malnutrition Screening Tool (MST) Report  Insignificant weight loss per time frame in chart.  Wt Readings from Last 15 Encounters:  10/27/18 64 kg  10/27/18 68 kg  11/18/17 72.6 kg  02/17/15 68 kg    Body mass index is 20.82 kg/m. Patient meets criteria for normal based on current BMI.  Labs and medications reviewed.   No nutrition interventions warranted at this time. If nutrition issues arise, please consult RD.   Tilda Franco, MS, RD, LDN [facility] Inpatient Clinical Dietitian Pager: [phone] After Hours Pager: [phone]

## 2018-10-29 MED ORDER — TRAZODONE HCL 150 MG PO TABS
150.0000 mg | ORAL_TABLET | Freq: Every day | ORAL | Status: DC
  Administered 2018-10-29: 150 mg via ORAL
  Filled 2018-10-29 (×2): qty 1
  Filled 2018-10-29: qty 7

## 2018-10-29 MED ORDER — FLUOXETINE HCL 20 MG PO CAPS
20.0000 mg | ORAL_CAPSULE | Freq: Every day | ORAL | Status: DC
  Administered 2018-10-29 – 2018-10-30 (×2): 20 mg via ORAL
  Filled 2018-10-29 (×2): qty 1
  Filled 2018-10-29: qty 7
  Filled 2018-10-29: qty 1

## 2018-10-29 NOTE — Progress Notes (Signed)
Patient attended grief and loss group facilitated by Burnis Kingfisher, Mdiv, [facility], and Ethel Rana, MS, [facility], N[facility].   Group focuses on change and loss experienced by group members; topics include loss due to death, loss of relationships, loss of a place, loss of sense of self, etc. Group members are invited to share the changes and losses that are currently affecting their lives. Facilitators tie together shared experiences between group members and validate emotions felt by participants.  Patient Caleb Rose attended and participated in group. Pt shared that he recognizes that his primary emotional expression is anger, which he copes with through substance use, and that he wants to shift away from anger toward addressing underlying emotions. Pt shared his primary motivator is to create a life for his children that is better than the one he had growing up; his childhood included foster care, a mother with mental health disorders, and an absent father. Pt shared it is hard to trust others and found group a safe place to share today as it was one of his goals to open up more to others. Pt noted feeling lighter after sharing in group and was hopeful to find spaces post-discharge to continue to feel supported. Pt acknowledged that grief appears in his life at various times, such as reminders of his absent father and he is looking for parental guidance that is missing; he sees this grief as motivation to make his children's lives different than his own. Facilitator validated and normalized these feelings and encouraged continued awareness of emotions and triggers.

## 2018-10-29 NOTE — Progress Notes (Signed)
CSW met with patient at bedside to introduce self and role. Patient reports feeling better and benefiting from inpatient care and group therapy.  CSW reviewed patient's follow up with [facility] outpatient in [place]. Patient reports he plans to discharge to stay with family in [place]. Patient politely declines residential treatment at this time but requested information regarding residential treatment facilities if he feels that outpatient is not enough.  Stephanie Acre, LCSW-A Clinical Social Worker

## 2018-10-29 NOTE — Progress Notes (Signed)
[facility] MD Progress Note  10/29/2018 9:34 AM Caleb BudMark A Rose  MRN:  [id number] Subjective:    Patient seen he is alert and oriented and generally cooperative seems to be insightful about his condition, impulsivity and anger outburst or what he wants to target he is of course cautioned to stay alcohol and drug free. Dentally cooperative no involuntary movements no psychosis No thoughts of harming self and no thoughts of harming others Gust medication options/cognitive therapy  Principal Problem: MDD (major depressive disorder), severe (HCC) Diagnosis: Principal Problem:   MDD (major depressive disorder), severe (HCC)  Total Time spent with patient: 20 minutes  Past Medical History: History reviewed. No pertinent past medical history. History reviewed. No pertinent surgical history. Family History: History reviewed. No pertinent family history.  Social History:  Social History   Substance and Sexual Activity  Alcohol Use Yes   Comment: OCC- pt reports one drink tonight     Social History   Substance and Sexual Activity  Drug Use Yes  . Types: Marijuana    Social History   Socioeconomic History  . Marital status: Single    Spouse name: Not on file  . Number of children: Not on file  . Years of education: Not on file  . Highest education level: Not on file  Occupational History  . Not on file  Social Needs  . Financial resource strain: Somewhat hard  . Food insecurity:    Worry: Never true    Inability: Never true  . Transportation needs:    Medical: No    Non-medical: No  Tobacco Use  . Smoking status: Current Every Day Smoker    Packs/day: 0.50    Types: Cigarettes  . Smokeless tobacco: Never Used  . Tobacco comment: less than 1/2 pack a day  Substance and Sexual Activity  . Alcohol use: Yes    Comment: OCC- pt reports one drink tonight  . Drug use: Yes    Types: Marijuana  . Sexual activity: Not on file  Lifestyle  . Physical activity:    Days per week:  Patient refused    Minutes per session: Patient refused  . Stress: Rather much  Relationships  . Social connections:    Talks on phone: Patient refused    Gets together: Patient refused    Attends religious service: Patient refused    Active member of club or organization: Patient refused    Attends meetings of clubs or organizations: Patient refused    Relationship status: Patient refused  Other Topics Concern  . Not on file  Social History Narrative  . Not on file   Sleep: Good  Appetite:  Good  Current Medications: Current Facility-Administered Medications  Medication Dose Route Frequency Provider Last Rate Last Dose  . acetaminophen (TYLENOL) tablet 650 mg  650 mg Oral Q6H PRN Money, Gerlene Burdockravis B, FNP      . alum & mag hydroxide-simeth (MAALOX/MYLANTA) 200-200-20 MG/5ML suspension 30 mL  30 mL Oral Q4H PRN Money, Gerlene Burdockravis B, FNP      . chlordiazePOXIDE (LIBRIUM) capsule 25 mg  25 mg Oral QID PRN Antonieta Pertlary, Greg Lawson, MD      . feeding supplement (ENSURE ENLIVE) (ENSURE ENLIVE) liquid 237 mL  237 mL Oral BID BM Cobos, Rockey SituFernando A, MD   237 mL at 10/29/18 0758  . FLUoxetine (PROZAC) capsule 20 mg  20 mg Oral Daily Malvin JohnsFarah, Shreshta Medley, MD      . hydrOXYzine (ATARAX/VISTARIL) tablet 25 mg  25 mg Oral TID  PRN Money, Gerlene Burdock, FNP   25 mg at 10/29/18 0759  . magnesium hydroxide (MILK OF MAGNESIA) suspension 30 mL  30 mL Oral Daily PRN Money, Feliz Beam B, FNP      . nicotine polacrilex (NICORETTE) gum 2 mg  2 mg Oral PRN Cobos, Rockey Situ, MD      . traZODone (DESYREL) tablet 50 mg  50 mg Oral QHS PRN Money, Gerlene Burdock, FNP        Lab Results: No results found for this or any previous visit (from the past 48 hour(s)).  Blood Alcohol level:  Lab Results  Component Value Date   ETH 12 (H) 10/27/2018    Metabolic Disorder Labs: No results found for: HGBA1C, MPG No results found for: PROLACTIN No results found for: CHOL, TRIG, HDL, CHOLHDL, VLDL, LDLCALC  Physical Findings: AIMS: Facial and Oral  Movements Muscles of Facial Expression: None, normal Lips and Perioral Area: None, normal Jaw: None, normal Tongue: None, normal,Extremity Movements Upper (arms, wrists, hands, fingers): None, normal Lower (legs, knees, ankles, toes): None, normal, Trunk Movements Neck, shoulders, hips: None, normal, Overall Severity Severity of abnormal movements (highest score from questions above): None, normal Incapacitation due to abnormal movements: None, normal Patient's awareness of abnormal movements (rate only patient's report): No Awareness, Dental Status Current problems with teeth and/or dentures?: No Does patient usually wear dentures?: No  CIWA:  CIWA-Ar Total: 0 COWS:     Musculoskeletal: Strength & Muscle Tone: within normal limits Gait & Station: normal Patient leans: N/A  Psychiatric Specialty Exam: Physical Exam  ROS  Blood pressure 113/75, pulse 63, temperature 98.6 F (37 C), temperature source Oral, resp. rate 18, height 5\' 9"  (1.753 m), weight 64 kg.Body mass index is 20.82 kg/m.  General Appearance: Casual  Eye Contact:  Good  Speech:  Clear and Coherent  Volume:  Normal  Mood:  Depressed  Affect:  Congruent  Thought Process:  Coherent  Orientation:  Full (Time, Place, and Person)  Thought Content:  Rumination  Suicidal Thoughts:  No  Homicidal Thoughts:  No  Memory:  Immediate;   Good  Judgement:  Good  Insight:  Good  Psychomotor Activity:  Normal  Concentration:  Concentration: Good  Recall:  Good  Fund of Knowledge:  Good  Language:  Good  Akathisia:  Negative  Handed:  Right  AIMS (if indicated):     Assets:  Leisure Time Physical Health Resilience  ADL's:  Intact  Cognition:  WNL  Sleep:  Number of Hours: 3.25     Treatment Plan Summary: Daily contact with patient to assess and evaluate symptoms and progress in treatment, Medication management and Plan And fluoxetine for anger management and depression continue cognitive and reality based  therapies may go tomorrow of course continue rehab groups needs to abstain from alcohol and cannabis also add sleep a  Tristine Langi, MD 10/29/2018, 9:34 AM

## 2018-10-29 NOTE — Progress Notes (Signed)
Counselor Ethel Rana, MS, [facility], NCC checked in with patient Caleb Rose following Grief and Loss group. Patient shared that he feels lighter after sharing his emotions in group. Pt reported he does not have spaces in his life where he can share his emotions. Patient repeatedly stated that he cannot believe he did not come to treatment earlier and that he is imagining how his life would be different had he done treatment years ago. Pt described feelings and behaviors consistent with anhedonia, including a loss of pleasure in daily living tasks (showering, shaving) and in activities he used to enjoy (playing video games).   Pt described a recent visit to see his mother in [place]. During this visit, he went to get his birth certificate and saw that his father never signed it. Patient returned home and reported feeling sadness and anger, prompting his desire to create a different life for his children. Patient discussed his relationship with his children's mother and reported he hopes she will take him back and he is scared she will not. Pt reports he can see why she would not want to take him back and it makes him sad.  Counselor validated patient's feelings. Counselor affirmed patient's decision to come to treatment and to share in group today. Patient reported being happy he shared in group and being grateful for an environment where he can share what is going on and connect his experience to others in the group.  Counselor told patient she will check in with him again tomorrow, 10/30/2018.

## 2018-10-29 NOTE — Tx Team (Signed)
Interdisciplinary Treatment and Diagnostic Plan Update  10/29/2018 Time of Session: 9:00am Caleb Rose A Gerhold MRN: [id number]  Principal Diagnosis: MDD (major depressive disorder), severe (HCC)  Secondary Diagnoses: Principal Problem:   MDD (major depressive disorder), severe (HCC)   Current Medications:  Current Facility-Administered Medications  Medication Dose Route Frequency Provider Last Rate Last Dose  . acetaminophen (TYLENOL) tablet 650 mg  650 mg Oral Q6H PRN Money, Gerlene Burdockravis B, FNP      . alum & mag hydroxide-simeth (MAALOX/MYLANTA) 200-200-20 MG/5ML suspension 30 mL  30 mL Oral Q4H PRN Money, Gerlene Burdockravis B, FNP      . chlordiazePOXIDE (LIBRIUM) capsule 25 mg  25 mg Oral QID PRN Antonieta Pertlary, Greg Lawson, MD      . feeding supplement (ENSURE ENLIVE) (ENSURE ENLIVE) liquid 237 mL  237 mL Oral BID BM Cobos, Rockey SituFernando A, MD   237 mL at 10/29/18 0758  . FLUoxetine (PROZAC) capsule 20 mg  20 mg Oral Daily Malvin JohnsFarah, Brian, MD      . hydrOXYzine (ATARAX/VISTARIL) tablet 25 mg  25 mg Oral TID PRN Money, Gerlene Burdockravis B, FNP   25 mg at 10/29/18 0759  . magnesium hydroxide (MILK OF MAGNESIA) suspension 30 mL  30 mL Oral Daily PRN Money, Feliz Beamravis B, FNP      . nicotine polacrilex (NICORETTE) gum 2 mg  2 mg Oral PRN Cobos, Rockey SituFernando A, MD      . traZODone (DESYREL) tablet 150 mg  150 mg Oral QHS Malvin JohnsFarah, Brian, MD       PTA Medications: Medications Prior to Admission  Medication Sig Dispense Refill Last Dose  . amoxicillin (AMOXIL) 500 MG capsule Take 1 capsule (500 mg total) by mouth 3 (three) times daily. 21 capsule 0   . fluticasone (FLONASE) 50 MCG/ACT nasal spray Place 2 sprays into the nose daily. 16 g 0   . ibuprofen (ADVIL,MOTRIN) 600 MG tablet Take 1 tablet (600 mg total) by mouth 4 (four) times daily. 30 tablet 0   . naproxen (NAPROSYN) 500 MG tablet Take 1 tablet (500 mg total) by mouth 2 (two) times daily. 15 tablet 0   . penicillin v potassium (VEETID) 500 MG tablet Take 1 tablet (500 mg total) by mouth 3  (three) times daily. 21 tablet 0     Patient Stressors: Financial difficulties Marital or family conflict Occupational concerns Substance abuse  Patient Strengths: Ability for insight Active sense of humor Average or above average intelligence Capable of independent living Communication skills Motivation for treatment/growth Physical Health Supportive family/friends Work skills  Treatment Modalities: Medication Management, Group therapy, Case management,  1 to 1 session with clinician, Psychoeducation, Recreational therapy.   Physician Treatment Plan for Primary Diagnosis: MDD (major depressive disorder), severe (HCC) Long Term Goal(s): Improvement in symptoms so as ready for discharge Improvement in symptoms so as ready for discharge   Short Term Goals: Ability to disclose and discuss suicidal ideas Ability to demonstrate self-control will improve Ability to identify and develop effective coping behaviors will improve Ability to maintain clinical measurements within normal limits will improve Compliance with prescribed medications will improve Ability to identify changes in lifestyle to reduce recurrence of condition will improve Ability to verbalize feelings will improve Ability to maintain clinical measurements within normal limits will improve Ability to identify triggers associated with substance abuse/mental health issues will improve  Medication Management: Evaluate patient's response, side effects, and tolerance of medication regimen.  Therapeutic Interventions: 1 to 1 sessions, Unit Group sessions and Medication administration.  Evaluation of  Outcomes: Progressing  Physician Treatment Plan for Secondary Diagnosis: Principal Problem:   MDD (major depressive disorder), severe (HCC)  Long Term Goal(s): Improvement in symptoms so as ready for discharge Improvement in symptoms so as ready for discharge   Short Term Goals: Ability to disclose and discuss suicidal  ideas Ability to demonstrate self-control will improve Ability to identify and develop effective coping behaviors will improve Ability to maintain clinical measurements within normal limits will improve Compliance with prescribed medications will improve Ability to identify changes in lifestyle to reduce recurrence of condition will improve Ability to verbalize feelings will improve Ability to maintain clinical measurements within normal limits will improve Ability to identify triggers associated with substance abuse/mental health issues will improve     Medication Management: Evaluate patient's response, side effects, and tolerance of medication regimen.  Therapeutic Interventions: 1 to 1 sessions, Unit Group sessions and Medication administration.  Evaluation of Outcomes: Progressing   RN Treatment Plan for Primary Diagnosis: MDD (major depressive disorder), severe (HCC) Long Term Goal(s): Knowledge of disease and therapeutic regimen to maintain health will improve  Short Term Goals: Ability to remain free from injury will improve, Ability to verbalize frustration and anger appropriately will improve, Ability to demonstrate self-control and Ability to identify and develop effective coping behaviors will improve  Medication Management: RN will administer medications as ordered by provider, will assess and evaluate patient's response and provide education to patient for prescribed medication. RN will report any adverse and/or side effects to prescribing provider.  Therapeutic Interventions: 1 on 1 counseling sessions, Psychoeducation, Medication administration, Evaluate responses to treatment, Monitor vital signs and CBGs as ordered, Perform/monitor CIWA, COWS, AIMS and Fall Risk screenings as ordered, Perform wound care treatments as ordered.  Evaluation of Outcomes: Progressing   LCSW Treatment Plan for Primary Diagnosis: MDD (major depressive disorder), severe (HCC) Long Term  Goal(s): Safe transition to appropriate next level of care at discharge, Engage patient in therapeutic group addressing interpersonal concerns.  Short Term Goals: Engage patient in aftercare planning with referrals and resources, Increase social support, Increase emotional regulation, Identify triggers associated with mental health/substance abuse issues and Increase skills for wellness and recovery  Therapeutic Interventions: Assess for all discharge needs, 1 to 1 time with Social worker, Explore available resources and support systems, Assess for adequacy in community support network, Educate family and significant other(s) on suicide prevention, Complete Psychosocial Assessment, Interpersonal group therapy.  Evaluation of Outcomes: Progressing   Progress in Treatment: Attending groups: Yes. Participating in groups: Yes. Taking medication as prescribed: Yes. Toleration medication: Yes. Family/Significant other contact made: Yes, individual(s) contacted:  GF and then completed SPE with patient Patient understands diagnosis: Yes. Discussing patient identified problems/goals with staff: Yes. Medical problems stabilized or resolved: Yes. Denies suicidal/homicidal ideation: No. Issues/concerns per patient self-inventory: Yes.  New problem(s) identified: Yes, Describe:  patient's GF reportedly informed weekend CSW that she is taking out a restraining order against the patient  New Short Term/Long Term Goal(s): detox, medication management for mood stabilization; elimination of SI thoughts; development of comprehensive mental wellness/sobriety plan.  Patient Goals:  "Opening up, stop keeping things inside"  Discharge Plan or Barriers: Follow up with [facility]. [facility]AG pamphlet, Mobile Crisis information, and AA/NA information provided to patient for additional community support and resources.   Reason for Continuation of Hospitalization: Anxiety Depression Withdrawal  symptoms  Estimated Length of Stay: 3-5 days  Attendees: Patient: 10/29/2018 11:41 AM  Physician:  10/29/2018 11:41 AM  Nursing:  10/29/2018 11:41 AM  RN Care Manager: 10/29/2018 11:41 AM  Social Worker: Enid Cutter, [place] 10/29/2018 11:41 AM  Recreational Therapist:  10/29/2018 11:41 AM  Other:  10/29/2018 11:41 AM  Other:  10/29/2018 11:41 AM  Other: 10/29/2018 11:41 AM    Scribe for Treatment Team: Darreld Mclean, LCSWA 10/29/2018 11:41 AM

## 2018-10-29 NOTE — Progress Notes (Signed)
Recreation Therapy Notes  Date:  2.3.20 Time: 0930 Location: [address] Dayroom  Group Topic: Stress Management  Goal Area(s) Addresses:  Patient will identify positive stress management techniques. Patient will identify benefits of using stress management post d/c.  Behavioral Response: Engaged  Intervention:  Stress Management  Activity :  Guided Imagery.  LRT introduced the stress management technique of guided imagery.  LRT read a script that took patients on a journey to the beach at sunrise.  Patients were to listen and follow along as script was read by LRT to engage in activity.  Education:  Stress Management, Discharge Planning.   Education Outcome: Acknowledges Education  Clinical Observations/Feedback: Pt attended and participated in group.      Anasophia Pecor, LRT/CTRS         Amadeus Oyama A 10/29/2018 10:52 AM 

## 2018-10-29 NOTE — Plan of Care (Signed)
  Problem: Education: Goal: Knowledge of [place] [facility] Education information/materials will improve Outcome: Progressing Goal: Emotional status will improve Outcome: Progressing Goal: Mental status will improve Outcome: Progressing Goal: Verbalization of understanding the information provided will improve Outcome: Progressing   

## 2018-10-29 NOTE — Progress Notes (Signed)
Attend group 

## 2018-10-30 MED ORDER — NICOTINE POLACRILEX 2 MG MT GUM: 2.0000 mg | CHEWING_GUM | OROMUCOSAL | 0 refills | Status: DC | PRN

## 2018-10-30 MED ORDER — HYDROXYZINE HCL 25 MG PO TABS: 25.0000 mg | ORAL_TABLET | Freq: Three times a day (TID) | ORAL | 0 refills | Status: DC | PRN

## 2018-10-30 MED ORDER — AMOXICILLIN 500 MG PO CAPS: 500.0000 mg | ORAL_CAPSULE | Freq: Three times a day (TID) | ORAL | 0 refills | Status: DC

## 2018-10-30 MED ORDER — TRAZODONE HCL 150 MG PO TABS: 150.0000 mg | ORAL_TABLET | Freq: Every day | ORAL | 0 refills | Status: DC

## 2018-10-30 MED ORDER — FLUOXETINE HCL 20 MG PO CAPS: 20.0000 mg | ORAL_CAPSULE | Freq: Every day | ORAL | 0 refills | Status: DC

## 2018-10-30 NOTE — [facility] Suicide Risk Assessment (Signed)
[facility] INPATIENT:  Family/Significant Other Suicide Prevention Education  Suicide Prevention Education:  Patient Refusal for Family/Significant Other Suicide Prevention Education: The patient Caleb Rose has refused to provide written consent for family/significant other to be provided Family/Significant Other Suicide Prevention Education during admission and/or prior to discharge.  Physician notified.  SPE completed with patient, as patient refused to consent to family contact. SPI pamphlet provided to pt and pt was encouraged to share information with support network, ask questions, and talk about any concerns relating to SPE. Patient denies access to guns/firearms and verbalized understanding of information provided. Mobile Crisis information also provided to patient.    Caleb Rose 10/30/2018, 2:12 PM

## 2018-10-30 NOTE — Progress Notes (Signed)
  [facility] Adult Case Management Discharge Plan :  Will you be returning to the same living situation after discharge:  Yes,  patient reports he is returning home with a friend At discharge, do you have transportation home?: Yes,  patient reports his friend is picking him up at discharge Do you have the ability to pay for your medications: No.  Release of information consent forms completed and in the chart;  Patient's signature needed at discharge.  Patient to Follow up at: Follow-up Information    [facility] Follow up on 11/06/2018.   Why:  Your hospital discharge appointment is 2/11 at 10:00a. Please bring your photo ID, proof of insurance, SSN, current medications, and discharge paperwork from this hospitalization.  Contact information: [address] [place] [address] [phone]           Next level of care provider has access to [facility] Link:yes  Safety Planning and Suicide Prevention discussed: Yes,  with the patient   Have you used any form of tobacco in the last 30 days? (Cigarettes, Smokeless Tobacco, Cigars, and/or Pipes): Yes  Has patient been referred to the Quitline?: Patient refused referral  Patient has been referred for addiction treatment: Pt. refused referral  Maeola Sarah, LCSWA 10/30/2018, 2:12 PM

## 2018-10-30 NOTE — Progress Notes (Signed)
D: Patient observed in dayroom with peers watching tv. Behavior has been appropriate, no angry outbursts. Patient is polite and appreciative of care. Patient states, 'it's been a good day. I don't have any concerns right now." Patient's affect anxious. Denies pain, physical complaints and denies withdrawal.   A: Medicated per orders, no prns requested or required. Medication education provided. Level III obs in place for safety. Emotional support offered. Patient encouraged to complete Suicide Safety Plan before discharge. Encouraged to attend and participate in unit programming.   R: Patient verbalizes understanding of POC. Patient denies SI/HI/AVH and remains safe on level III obs. Will continue to monitor throughout the night.

## 2018-10-30 NOTE — Progress Notes (Signed)
Pt d/c from the hospital. All items returned. D/C instructions given, prescriptions given and samples given. Suicide information given. Pt educated on signs and symptoms of the flu virus and instructed to follow up with urgent care if needed. Pt acknowledges understanding.

## 2018-10-30 NOTE — Discharge Summary (Signed)
Physician Discharge Summary Note  Patient:  Caleb Rose Caleb Rose is an 38 y.o., male  MRN:  [id number]  DOB:  07/20/1981  Patient phone:  [phone] (home)   Patient address:   [address] [place] [id number],   Total Time spent with patient: Greater than 30 minutes  Date of Admission:  10/27/2018  Date of Discharge: 02-04- 2020  Reason for Admission: Worsening agitation, anger issues & suicidal ideations.  Principal Problem: MDD (major depressive disorder), severe (HCC)  Discharge Diagnoses: Principal Problem:   MDD (major depressive disorder), severe (HCC)  Past Psychiatric History: Alcohol & THC use disorders, Major depressive disorder.  Past Medical History: History reviewed. No pertinent past medical history. History reviewed. No pertinent surgical history.  Family History: History reviewed. No pertinent family history.  Family Psychiatric  History: See H&P  Social History:  Social History   Substance and Sexual Activity  Alcohol Use Yes   Comment: OCC- pt reports one drink tonight     Social History   Substance and Sexual Activity  Drug Use Yes  . Types: Marijuana    Social History   Socioeconomic History  . Marital status: Single    Spouse name: Not on file  . Number of children: Not on file  . Years of education: Not on file  . Highest education level: Not on file  Occupational History  . Not on file  Social Needs  . Financial resource strain: Somewhat hard  . Food insecurity:    Worry: Never true    Inability: Never true  . Transportation needs:    Medical: No    Non-medical: No  Tobacco Use  . Smoking status: Current Every Day Smoker    Packs/day: 0.50    Types: Cigarettes  . Smokeless tobacco: Never Used  . Tobacco comment: less than 1/2 pack Caleb day  Substance and Sexual Activity  . Alcohol use: Yes    Comment: OCC- pt reports one drink tonight  . Drug use: Yes    Types: Marijuana  . Sexual activity: Not on file  Lifestyle   . Physical activity:    Days per week: Patient refused    Minutes per session: Patient refused  . Stress: Rather much  Relationships  . Social connections:    Talks on phone: Patient refused    Gets together: Patient refused    Attends religious service: Patient refused    Active member of club or organization: Patient refused    Attends meetings of clubs or organizations: Patient refused    Relationship status: Patient refused  Other Topics Concern  . Not on file  Social History Narrative  . Not on file   Hospital Course: (Per Md's admission evaluation): Patient is Caleb 38 year old male with Caleb negative past psychiatric history who presented to the [facility] emergency department on 10/27/2018 with anger issues and suicidal ideation. The patient stated that he had had increased recent stressors including losing his job.  He stated that over the last several years he has "been holding things in", is not discussed things with his significant other or other family members.  He stated he would go to the bathroom and locked the door, smoke marijuana and take shots of alcohol.  He stated that he had been adopted at Caleb young age.  He stated that his mother was probably schizophrenic, and they had been taken away from her as an adolescent in [place]. The patient stated he had been fired from his  job 4 days prior to admission.  The police were called because he became agitated.  He stated he feels like he needs to talk to someone about his emotions.  He denied any symptoms consistent with bipolar disorder outside of irritability.  No excessive spending.  He stated he uses approximately half ounce of marijuana weekly, and has been drinking 1/5 of alcohol every 3 days.  He denied any previous suicide attempts, psychiatric medications or psychiatric admissions.  Caleb Rose was admitted to the [facility] with Caleb blood alcohol level of 12 & UDS (+) for THC. He did admit during his admission evaluation that he has been  smoking weed & drinking alcohol. However, he was not presenting with any harsh substance withdrawal symptoms. As Caleb result, was started on Caleb prn Librium 25 mg po Q 6 hours for CIWA >10. Caleb Rose's reason for admission was related to increased finiancial stressors from losing his job. He became very depressed, agitated & suicidal. He was brought to the hospital for evaluation & treatment.  After the above admission evaluation, [name] was recommended for mood stabilization treatments. He was medicated, stabilized & discharged on the medications as listed below. He presented no other significant pre-existing medical issues that required treatment. He was enrolled & participated in the group counseling sessions being offered & held on this unit. He learned coping skills. He tolerated his treatment regimen without any adverse effects or reactions reported.  Caleb Rose's symptoms responded well to his treatment regimen. This is evidenced by his reports of improved mood, absence of agitation & suicidal thoughts. His anxiety symptoms are stabilized. There are no evidence of substance withdrawal symptoms. He appears to be medically & mentally stable to be discharged to continue mental health care & medication management on an outpatient basis as noted below. He is provided with all the necessary information needed to make this appointment without problems. Upon discharge, Caleb Rose adamantly denies any SIHI, AVH, delusional thoughts or paranoia. He received from the [facility] pharmacy, Caleb 7 days worth supply samples of his [facility] discharge medications. He was able to engage in safety planning including plan to return to [facility] or contact emergency services if he feels unable to maintain hisown safety or the safety of others. Pt had no further questions, comments or concerns. He left [facility] with all personal belongings in no apparent distress.   Physical Findings: AIMS: Facial and Oral Movements Muscles of Facial Expression: None, normal Lips and  Perioral Area: None, normal Jaw: None, normal Tongue: None, normal,Extremity Movements Upper (arms, wrists, hands, fingers): None, normal Lower (legs, knees, ankles, toes): None, normal, Trunk Movements Neck, shoulders, hips: None, normal, Overall Severity Severity of abnormal movements (highest score from questions above): None, normal Incapacitation due to abnormal movements: None, normal Patient's awareness of abnormal movements (rate only patient's report): No Awareness, Dental Status Current problems with teeth and/or dentures?: No Does patient usually wear dentures?: No  CIWA:  CIWA-Ar Total: 0 COWS:     Musculoskeletal: Strength & Muscle Tone: within normal limits Gait & Station: normal Patient leans: N/Caleb  Psychiatric Specialty Exam: Physical Exam  Nursing note and vitals reviewed. Constitutional: He appears well-developed.  HENT:  Head: Normocephalic.  Eyes: Pupils are equal, round, and reactive to light.  Neck: Normal range of motion.  Cardiovascular: Normal rate.  Respiratory: Effort normal.  GI: Soft.  Genitourinary:    Genitourinary Comments: Deferred   Musculoskeletal: Normal range of motion.  Neurological: He is alert.  Skin: Skin is warm.  Review of Systems  Constitutional: Negative.   HENT: Negative.   Eyes: Negative.   Respiratory: Negative.  Negative for cough and shortness of breath.   Cardiovascular: Negative.  Negative for chest pain and palpitations.  Gastrointestinal: Negative.  Negative for abdominal pain, heartburn, nausea and vomiting.  Genitourinary: Negative.   Musculoskeletal: Negative.   Skin: Negative.   Neurological: Negative.  Negative for dizziness and headaches.  Endo/Heme/Allergies: Negative.   Psychiatric/Behavioral: Positive for depression (Stabilized with medication prior to discharge) and substance abuse (Hx. alcohol & THC use disorders). Negative for hallucinations, memory loss and suicidal ideas. The patient has insomnia  (Stabilized with medication prior to discharge). The patient is not nervous/anxious (Stable).     Blood pressure 107/69, pulse 98, temperature 99 F (37.2 C), resp. rate 18, height 5\' 9"  (1.753 m), weight 64 kg.Body mass index is 20.82 kg/m.  See Md's discharge SRA   Have you used any form of tobacco in the last 30 days? (Cigarettes, Smokeless Tobacco, Cigars, and/or Pipes): Yes  Has this patient used any form of tobacco in the last 30 days? (Cigarettes, Smokeless Tobacco, Cigars, and/or Pipes): Yes, an FDA-approved tobacco cessation medication was offered at discharge.  Blood Alcohol level:  Lab Results  Component Value Date   ETH 12 (H) 10/27/2018   Metabolic Disorder Labs:  No results found for: HGBA1C, MPG No results found for: PROLACTIN No results found for: CHOL, TRIG, HDL, CHOLHDL, VLDL, LDLCALC  See Psychiatric Specialty Exam and Suicide Risk Assessment completed by Attending Physician prior to discharge.  Discharge destination:  Home  Is patient on multiple antipsychotic therapies at discharge:  No   Has Patient had three or more failed trials of antipsychotic monotherapy by history:  No  Recommended Plan for Multiple Antipsychotic Therapies: NA  Allergies as of 10/30/2018   No Known Allergies     Medication List    STOP taking these medications   fluticasone 50 MCG/ACT nasal spray Commonly known as:  FLONASE   ibuprofen 600 MG tablet Commonly known as:  ADVIL,MOTRIN   naproxen 500 MG tablet Commonly known as:  NAPROSYN   penicillin v potassium 500 MG tablet Commonly known as:  VEETID     TAKE these medications     Indication  amoxicillin 500 MG capsule Commonly known as:  AMOXIL Take 1 capsule (500 mg total) by mouth 3 (three) times daily. For infection What changed:  additional instructions  Indication:  Infection   FLUoxetine 20 MG capsule Commonly known as:  PROZAC Take 1 capsule (20 mg total) by mouth daily. For depression Start taking on:   October 31, 2018  Indication:  Depression   hydrOXYzine 25 MG tablet Commonly known as:  ATARAX/VISTARIL Take 1 tablet (25 mg total) by mouth 3 (three) times daily as needed for anxiety.  Indication:  Feeling Anxious   nicotine polacrilex 2 MG gum Commonly known as:  NICORETTE Take 1 each (2 mg total) by mouth as needed for smoking cessation. (May buy frpm over the counter): For smoking cessation  Indication:  Nicotine Addiction   traZODone 150 MG tablet Commonly known as:  DESYREL Take 1 tablet (150 mg total) by mouth at bedtime. For sleep  Indication:  Trouble Sleeping      Follow-up Information    [facility], [facility] Follow up on 11/06/2018.   Why:  Your hospital discharge appointment is 2/11 at 10:00a. Please bring your photo ID, proof of insurance, SSN, current medications, and discharge paperwork from this hospitalization.  Contact information: [address] [place] [address] [phone]          Follow-up recommendations: Activity:  As tolerated Diet: As recommended by your primary care doctor. Keep all scheduled follow-up appointments as recommended.  Comments: Patient is instructed prior to discharge to: Take all medications as prescribed by his/her mental healthcare provider. Report any adverse effects and or reactions from the medicines to his/her outpatient provider promptly. Patient has been instructed & cautioned: To not engage in alcohol and or illegal drug use while on prescription medicines. In the event of worsening symptoms, patient is instructed to call the crisis hotline, 911 and or go to the nearest ED for appropriate evaluation and treatment of symptoms. To follow-up with his/her primary care provider for your other medical issues, concerns and or health care needs.   Signed: Armandina StammerAgnes Kyrielle Urbanski, NP, PMHNP, FNP-BC 10/30/2018, 10:08 AM

## 2018-10-30 NOTE — BHH Suicide Risk Assessment (Signed)
[facility] Discharge Suicide Risk Assessment   Principal Problem: MDD (major depressive disorder), severe (HCC) Discharge Diagnoses: Principal Problem:   MDD (major depressive disorder), severe (HCC) Currently is alert oriented cooperative and denies current thoughts of harming self or others believes that the medications are helpful no psychosis no evidence of acute dangerousness  Total Time spent with patient: 30 minutes Mental Status Per Nursing Assessment::   On Admission:  Suicidal ideation indicated by others  Demographic Factors:  Male  Loss Factors: Financial problems/change in socioeconomic status  Historical Factors: NA  Risk Reduction Factors:   Sense of responsibility to family and Religious beliefs about death  Continued Clinical Symptoms:  Dysthymia  Cognitive Features That Contribute To Risk:  None    Suicide Risk:  Minimal: No identifiable suicidal ideation.  Patients presenting with no risk factors but with morbid ruminations; may be classified as minimal risk based on the severity of the depressive symptoms  Follow-up Information    [facility] Follow up on 11/06/2018.   Why:  Your hospital discharge appointment is 2/11 at 10:00a. Please bring your photo ID, proof of insurance, SSN, current medications, and discharge paperwork from this hospitalization.  Contact information: [address] [place] [address] [phone]           Plan Of Care/Follow-up recommendations:  Activity:  full  Azora Bonzo, MD 10/30/2018, 7:40 AM

## 2022-01-30 ENCOUNTER — Inpatient Hospital Stay (HOSPITAL_COMMUNITY)
Admission: EM | Admit: 2022-01-30 | Discharge: 2022-02-15 | DRG: 028 | Disposition: A | Discharge status: Rehabilitation Facility | Payer: Worker's Compensation | Attending: Neurosurgery | Admitting: Neurosurgery

## 2022-01-30 ENCOUNTER — Encounter (HOSPITAL_COMMUNITY): Admission: EM | Disposition: A | Discharge status: Rehabilitation Facility | Payer: Self-pay | Source: Home / Self Care

## 2022-01-30 ENCOUNTER — Other Ambulatory Visit: Payer: Self-pay

## 2022-01-30 ENCOUNTER — Emergency Department (HOSPITAL_COMMUNITY): Payer: Worker's Compensation

## 2022-01-30 ENCOUNTER — Emergency Department (HOSPITAL_COMMUNITY): Payer: Worker's Compensation | Admitting: Anesthesiology

## 2022-01-30 ENCOUNTER — Encounter (HOSPITAL_COMMUNITY): Payer: Self-pay | Admitting: Emergency Medicine

## 2022-01-30 DIAGNOSIS — S12400A Unspecified displaced fracture of fifth cervical vertebra, initial encounter for closed fracture: Secondary | ICD-10-CM | POA: Diagnosis present

## 2022-01-30 DIAGNOSIS — R339 Retention of urine, unspecified: Secondary | ICD-10-CM | POA: Diagnosis not present

## 2022-01-30 DIAGNOSIS — Z20822 Contact with and (suspected) exposure to covid-19: Secondary | ICD-10-CM | POA: Diagnosis present

## 2022-01-30 DIAGNOSIS — S13160A Subluxation of C5/C6 cervical vertebrae, initial encounter: Secondary | ICD-10-CM

## 2022-01-30 DIAGNOSIS — F430 Acute stress reaction: Secondary | ICD-10-CM | POA: Diagnosis present

## 2022-01-30 DIAGNOSIS — W228XXA Striking against or struck by other objects, initial encounter: Secondary | ICD-10-CM | POA: Diagnosis present

## 2022-01-30 DIAGNOSIS — Y990 Civilian activity done for income or pay: Secondary | ICD-10-CM

## 2022-01-30 DIAGNOSIS — Z9889 Other specified postprocedural states: Secondary | ICD-10-CM

## 2022-01-30 DIAGNOSIS — S14104A Unspecified injury at C4 level of cervical spinal cord, initial encounter: Principal | ICD-10-CM | POA: Diagnosis present

## 2022-01-30 DIAGNOSIS — S14109A Unspecified injury at unspecified level of cervical spinal cord, initial encounter: Secondary | ICD-10-CM | POA: Diagnosis present

## 2022-01-30 DIAGNOSIS — M62838 Other muscle spasm: Secondary | ICD-10-CM | POA: Diagnosis not present

## 2022-01-30 DIAGNOSIS — Y9259 Other trade areas as the place of occurrence of the external cause: Secondary | ICD-10-CM

## 2022-01-30 DIAGNOSIS — F17210 Nicotine dependence, cigarettes, uncomplicated: Secondary | ICD-10-CM | POA: Diagnosis present

## 2022-01-30 DIAGNOSIS — R0981 Nasal congestion: Secondary | ICD-10-CM | POA: Diagnosis present

## 2022-01-30 DIAGNOSIS — R509 Fever, unspecified: Secondary | ICD-10-CM | POA: Diagnosis not present

## 2022-01-30 DIAGNOSIS — S12500A Unspecified displaced fracture of sixth cervical vertebra, initial encounter for closed fracture: Secondary | ICD-10-CM | POA: Diagnosis present

## 2022-01-30 DIAGNOSIS — R319 Hematuria, unspecified: Secondary | ICD-10-CM | POA: Diagnosis not present

## 2022-01-30 DIAGNOSIS — G8250 Quadriplegia, unspecified: Secondary | ICD-10-CM | POA: Diagnosis present

## 2022-01-30 HISTORY — PX: ANTERIOR CERVICAL DECOMP/DISCECTOMY FUSION: SHX1161

## 2022-01-30 LAB — CBC
HCT: 41.5 % (ref 39.0–52.0)
Hemoglobin: 14.9 g/dL (ref 13.0–17.0)
MCH: 32.1 pg (ref 26.0–34.0)
MCHC: 35.9 g/dL (ref 30.0–36.0)
MCV: 89.4 fL (ref 80.0–100.0)
Platelets: 164 10*3/uL (ref 150–400)
RBC: 4.64 MIL/uL (ref 4.22–5.81)
RDW: 12.5 % (ref 11.5–15.5)
WBC: 6.5 10*3/uL (ref 4.0–10.5)
nRBC: 0 % (ref 0.0–0.2)

## 2022-01-30 LAB — COMPREHENSIVE METABOLIC PANEL
ALT: 27 U/L (ref 0–44)
AST: 29 U/L (ref 15–41)
Albumin: 3.8 g/dL (ref 3.5–5.0)
Alkaline Phosphatase: 62 U/L (ref 38–126)
Anion gap: 7 (ref 5–15)
BUN: 13 mg/dL (ref 6–20)
CO2: 25 mmol/L (ref 22–32)
Calcium: 9.6 mg/dL (ref 8.9–10.3)
Chloride: 110 mmol/L (ref 98–111)
Creatinine, Ser: 0.98 mg/dL (ref 0.61–1.24)
GFR, Estimated: >60 mL/min (ref >60)
Glucose, Bld: 96 mg/dL (ref 70–99)
Potassium: 4.2 mmol/L (ref 3.5–5.1)
Sodium: 142 mmol/L (ref 135–145)
Total Bilirubin: 0.8 mg/dL (ref 0.3–1.2)
Total Protein: 6.4 g/dL — ABNORMAL LOW (ref 6.5–8.1)

## 2022-01-30 LAB — RESP PANEL BY RT-PCR (FLU A&B, COVID) ARPGX2
Influenza A by PCR: NEGATIVE
Influenza B by PCR: NEGATIVE
SARS Coronavirus 2 by RT PCR: NEGATIVE

## 2022-01-30 LAB — I-STAT CHEM 8, ED
BUN: 14 mg/dL (ref 6–20)
Calcium, Ion: 1.11 mmol/L — ABNORMAL LOW (ref 1.15–1.40)
Chloride: 105 mmol/L (ref 98–111)
Creatinine, Ser: 1 mg/dL (ref 0.61–1.24)
Glucose, Bld: 91 mg/dL (ref 70–99)
HCT: 43 % (ref 39.0–52.0)
Hemoglobin: 14.6 g/dL (ref 13.0–17.0)
Potassium: 4.1 mmol/L (ref 3.5–5.1)
Sodium: 140 mmol/L (ref 135–145)
TCO2: 26 mmol/L (ref 22–32)

## 2022-01-30 LAB — SAMPLE TO BLOOD BANK

## 2022-01-30 LAB — ETHANOL: Alcohol, Ethyl (B): <10 mg/dL (ref <10)

## 2022-01-30 LAB — LACTIC ACID, PLASMA: Lactic Acid, Venous: 1.2 mmol/L (ref 0.5–1.9)

## 2022-01-30 SURGERY — ANTERIOR CERVICAL DECOMPRESSION/DISCECTOMY FUSION 1 LEVEL
Anesthesia: General | Site: Neck

## 2022-01-30 MED ORDER — FENTANYL CITRATE (PF) 250 MCG/5ML IJ SOLN
INTRAMUSCULAR | Status: AC
  Filled 2022-01-30: qty 5

## 2022-01-30 MED ORDER — FENTANYL CITRATE PF 50 MCG/ML IJ SOSY
PREFILLED_SYRINGE | INTRAMUSCULAR | Status: AC
  Administered 2022-01-30: 50 ug
  Filled 2022-01-30: qty 1

## 2022-01-30 MED ORDER — THROMBIN 20000 UNITS EX SOLR
CUTANEOUS | Status: AC
  Filled 2022-01-30: qty 20000

## 2022-01-30 MED ORDER — IOHEXOL 350 MG/ML SOLN
150.0000 mL | Freq: Once | INTRAVENOUS | Status: AC | PRN
  Administered 2022-01-30: 150 mL via INTRAVENOUS

## 2022-01-30 MED ORDER — LIDOCAINE-EPINEPHRINE 1 %-1:100000 IJ SOLN
INTRAMUSCULAR | Status: AC
  Filled 2022-01-30: qty 1

## 2022-01-30 MED ORDER — MIDAZOLAM HCL 2 MG/2ML IJ SOLN
INTRAMUSCULAR | Status: AC
  Filled 2022-01-30: qty 2

## 2022-01-30 MED ORDER — CEFAZOLIN SODIUM-DEXTROSE 2-4 GM/100ML-% IV SOLN
INTRAVENOUS | Status: AC
  Filled 2022-01-30: qty 100

## 2022-01-30 MED ORDER — SODIUM CHLORIDE 0.9 % IV SOLN: INTRAVENOUS | Status: DC

## 2022-01-30 MED ORDER — BUPIVACAINE HCL (PF) 0.5 % IJ SOLN
INTRAMUSCULAR | Status: AC
  Filled 2022-01-30: qty 30

## 2022-01-30 MED ORDER — THROMBIN 5000 UNITS EX SOLR
CUTANEOUS | Status: AC
  Filled 2022-01-30: qty 5000

## 2022-01-30 MED ORDER — SODIUM CHLORIDE 0.9 % IV BOLUS: 1000.0000 mL | Freq: Once | INTRAVENOUS | Status: DC

## 2022-01-30 MED ORDER — SODIUM CHLORIDE 0.9 % IV SOLN
INTRAVENOUS | Status: AC | PRN
Start: 2022-01-30 — End: 2022-01-30
  Administered 2022-01-30: 1000 mL via INTRAVENOUS

## 2022-01-30 SURGICAL SUPPLY — 64 items
ADH SKN CLS APL DERMABOND .7 (GAUZE/BANDAGES/DRESSINGS) ×1
APL SKNCLS STERI-STRIP NONHPOA (GAUZE/BANDAGES/DRESSINGS)
BAG COUNTER SPONGE SURGICOUNT (BAG) ×2 IMPLANT
BAG SPNG CNTER NS LX DISP (BAG) ×1
BAND INSRT 18 STRL LF DISP RB (MISCELLANEOUS) ×2
BAND RUBBER #18 3X1/16 STRL (MISCELLANEOUS) ×4 IMPLANT
BENZOIN TINCTURE PRP APPL 2/3 (GAUZE/BANDAGES/DRESSINGS) IMPLANT
BIT DRILL 13 (BIT) ×1 IMPLANT
BLADE CLIPPER SURG (BLADE) IMPLANT
BLADE SURG 11 STRL SS (BLADE) ×2 IMPLANT
BLADE ULTRA TIP 2M (BLADE) IMPLANT
BUR MATCHSTICK NEURO 3.0 LAGG (BURR) ×2 IMPLANT
CANISTER SUCT 3000ML PPV (MISCELLANEOUS) ×2 IMPLANT
CARTRIDGE OIL MAESTRO DRILL (MISCELLANEOUS) ×1 IMPLANT
DECANTER SPIKE VIAL GLASS SM (MISCELLANEOUS) ×2 IMPLANT
DERMABOND ADVANCED (GAUZE/BANDAGES/DRESSINGS) ×1
DERMABOND ADVANCED .7 DNX12 (GAUZE/BANDAGES/DRESSINGS) ×1 IMPLANT
DIFFUSER DRILL AIR PNEUMATIC (MISCELLANEOUS) ×2 IMPLANT
DRAPE C-ARM 42X72 X-RAY (DRAPES) ×4 IMPLANT
DRAPE HALF SHEET 40X57 (DRAPES) IMPLANT
DRAPE LAPAROTOMY 100X72 PEDS (DRAPES) ×2 IMPLANT
DRAPE MICROSCOPE LEICA (MISCELLANEOUS) ×2 IMPLANT
DRSG OPSITE 4X5.5 SM (GAUZE/BANDAGES/DRESSINGS) ×4 IMPLANT
DRSG OPSITE POSTOP 4X6 (GAUZE/BANDAGES/DRESSINGS) ×1 IMPLANT
DURAPREP 6ML APPLICATOR 50/CS (WOUND CARE) ×2 IMPLANT
ELECT COATED BLADE 2.86 ST (ELECTRODE) ×2 IMPLANT
ELECT REM PT RETURN 9FT ADLT (ELECTROSURGICAL) ×2
ELECTRODE REM PT RTRN 9FT ADLT (ELECTROSURGICAL) ×1 IMPLANT
GAUZE 4X4 16PLY ~~LOC~~+RFID DBL (SPONGE) IMPLANT
GLOVE BIO SURGEON STRL SZ7 (GLOVE) ×1 IMPLANT
GLOVE BIO SURGEON STRL SZ7.5 (GLOVE) ×1 IMPLANT
GLOVE BIOGEL PI IND STRL 7.5 (GLOVE) ×2 IMPLANT
GLOVE BIOGEL PI INDICATOR 7.5 (GLOVE) ×2
GLOVE ECLIPSE 7.0 STRL STRAW (GLOVE) ×2 IMPLANT
GLOVE EXAM NITRILE XL STR (GLOVE) IMPLANT
GOWN STRL REUS W/ TWL LRG LVL3 (GOWN DISPOSABLE) ×2 IMPLANT
GOWN STRL REUS W/ TWL XL LVL3 (GOWN DISPOSABLE) IMPLANT
GOWN STRL REUS W/TWL 2XL LVL3 (GOWN DISPOSABLE) IMPLANT
GOWN STRL REUS W/TWL LRG LVL3 (GOWN DISPOSABLE) ×4
GOWN STRL REUS W/TWL XL LVL3 (GOWN DISPOSABLE)
HEMOSTAT POWDER KIT SURGIFOAM (HEMOSTASIS) ×2 IMPLANT
KIT BASIN OR (CUSTOM PROCEDURE TRAY) ×2 IMPLANT
KIT TURNOVER KIT B (KITS) ×2 IMPLANT
NDL SPNL 22GX3.5 QUINCKE BK (NEEDLE) ×1 IMPLANT
NEEDLE HYPO 22GX1.5 SAFETY (NEEDLE) ×2 IMPLANT
NEEDLE SPNL 22GX3.5 QUINCKE BK (NEEDLE) ×2 IMPLANT
NS IRRIG 1000ML POUR BTL (IV SOLUTION) ×2 IMPLANT
OIL CARTRIDGE MAESTRO DRILL (MISCELLANEOUS) ×2
PACK LAMINECTOMY NEURO (CUSTOM PROCEDURE TRAY) ×2 IMPLANT
PAD ARMBOARD 7.5X6 YLW CONV (MISCELLANEOUS) ×6 IMPLANT
PLATE ELITE 21MM (Plate) ×1 IMPLANT
PUTTY DBF 1CC CORTICAL FIBERS (Putty) ×1 IMPLANT
SCREW SD 15MM FA (Screw) ×4 IMPLANT
SPACER LORD ENDO 14X16X9 6D (Spacer) ×1 IMPLANT
SPONGE INTESTINAL PEANUT (DISPOSABLE) ×2 IMPLANT
SPONGE SURGIFOAM ABS GEL SZ50 (HEMOSTASIS) ×2 IMPLANT
STAPLER VISISTAT 35W (STAPLE) ×2 IMPLANT
STRIP CLOSURE SKIN 1/2X4 (GAUZE/BANDAGES/DRESSINGS) IMPLANT
SUT VIC AB 3-0 SH 8-18 (SUTURE) ×2 IMPLANT
SUT VICRYL 3-0 RB1 18 ABS (SUTURE) ×4 IMPLANT
TAPE CLOTH 3X10 TAN LF (GAUZE/BANDAGES/DRESSINGS) ×2 IMPLANT
TOWEL GREEN STERILE (TOWEL DISPOSABLE) ×2 IMPLANT
TOWEL GREEN STERILE FF (TOWEL DISPOSABLE) ×2 IMPLANT
WATER STERILE IRR 1000ML POUR (IV SOLUTION) ×2 IMPLANT

## 2022-01-30 NOTE — Anesthesia Preprocedure Evaluation (Addendum)
Anesthesia Evaluation  ?Patient identified by MRN, date of birth, ID band ?Patient awake ? ? ? ?Reviewed: ?Allergy & Precautions, NPO status , Patient's Chart, lab work & pertinent test results ? ?Airway ?Mallampati: Unable to assess ? ? ?Neck ROM: Limited ? ? ? Dental ? ?(+) Teeth Intact, Dental Advisory Given ?  ?Pulmonary ?Current Smoker,  ?  ?Pulmonary exam normal ? ? ? ? ? ? ? Cardiovascular ?negative cardio ROS ? ? ?Rhythm:Regular Rate:Normal ? ? ?  ?Neuro/Psych ?PSYCHIATRIC DISORDERS Depression   ? GI/Hepatic ?negative GI ROS, Neg liver ROS,   ?Endo/Other  ?negative endocrine ROS ? Renal/GU ?negative Renal ROS  ? ?  ?Musculoskeletal ?negative musculoskeletal ROS ?(+)  ? Abdominal ?Normal abdominal exam  (+)   ?Peds ? Hematology ?  ?Anesthesia Other Findings ? ? Reproductive/Obstetrics ? ?  ? ? ? ? ? ? ? ? ? ? ? ? ? ?  ?  ? ? ? ? ? ? ? ?Anesthesia Physical ?Anesthesia Plan ? ?ASA: 2 and emergent ? ?Anesthesia Plan: General  ? ?Post-op Pain Management:   ? ?Induction: Rapid sequence, Intravenous and Cricoid pressure planned ? ?PONV Risk Score and Plan: 2 and Ondansetron, Dexamethasone and Midazolam ? ?Airway Management Planned: Oral ETT and Video Laryngoscope Planned ? ?Additional Equipment: None ? ?Intra-op Plan:  ? ?Post-operative Plan: Extubation in OR ? ?Informed Consent: I have reviewed the patients History and Physical, chart, labs and discussed the procedure including the risks, benefits and alternatives for the proposed anesthesia with the patient or authorized representative who has indicated his/her understanding and acceptance.  ? ? ? ?Dental advisory given ? ?Plan Discussed with: CRNA ? ?Anesthesia Plan Comments:   ? ? ? ? ? ?Anesthesia Quick Evaluation ? ?
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
## 2022-01-30 NOTE — ED Notes (Signed)
Neuro surgeon arrived. ?

## 2022-01-30 NOTE — ED Notes (Signed)
Pt to MRI w/ Trauma RN ?

## 2022-01-30 NOTE — ED Notes (Signed)
PT to CT w/ three RNs to maintain c-spine. ?

## 2022-01-30 NOTE — Consult Note (Deleted)
Reason for Consult:spinal cord injury ?Referring Physician: Rancour ? ?Caleb Rose is an 41 y.o. male.  ?HPI: Pt is a 41 yo M brought to the Ed as a level 2 trauma with suspected spinal cord injury.  He works in a [employer].  He was lifting something off a palate and while doing so, something fell around 1 story and struck him directly on top of the head.  He fell to the ground. He immediately had neck pain.  He felt weak and trauma was called.  The object was a pallet with goods on it that reportedly weighed 1000 pounds.   ? ?He complains of tingling in the extremities.  He denies SOB.  ? ?History reviewed. No pertinent past medical history. ? ?History reviewed. No pertinent surgical history. ? ?History reviewed. No pertinent family history. ? ?Social History:  reports that he has been smoking cigarettes. He has been smoking an average of .5 packs per day. He has never used smokeless tobacco. He reports current alcohol use. He reports current drug use. Drug: Marijuana. ? ?Allergies: No Known Allergies ? ?Medications:  ?No outpatient medications have been marked as taking for the 01/30/22 encounter [facility] Encounter).  ? ? ? ?Results for orders placed or performed during the hospital encounter of 01/30/22 (from the past 48 hour(s))  ?Comprehensive metabolic panel     Status: Abnormal  ? Collection Time: 01/30/22  9:00 PM  ?Result Value Ref Range  ? Sodium 142 135 - 145 mmol/L  ? Potassium 4.2 3.5 - 5.1 mmol/L  ? Chloride 110 98 - 111 mmol/L  ? CO2 25 22 - 32 mmol/L  ? Glucose, Bld 96 70 - 99 mg/dL  ?  Comment: Glucose reference range applies only to samples taken after fasting for at least 8 hours.  ? BUN 13 6 - 20 mg/dL  ? Creatinine, Ser 0.98 0.61 - 1.24 mg/dL  ? Calcium 9.6 8.9 - 10.3 mg/dL  ? Total Protein 6.4 (L) 6.5 - 8.1 g/dL  ? Albumin 3.8 3.5 - 5.0 g/dL  ? AST 29 15 - 41 U/L  ? ALT 27 0 - 44 U/L  ? Alkaline Phosphatase 62 38 - 126 U/L  ? Total Bilirubin 0.8 0.3 - 1.2 mg/dL  ? GFR, Estimated >60 >60  mL/min  ?  Comment: (NOTE) ?Calculated using the CKD-EPI Creatinine Equation (2021) ?  ? Anion gap 7 5 - 15  ?  Comment: Performed at [facility], [place] [address], [place], [place] [address]  ?I-Stat Chem 8, ED     Status: Abnormal  ? Collection Time: 01/30/22  9:00 PM  ?Result Value Ref Range  ? Sodium 140 135 - 145 mmol/L  ? Potassium 4.1 3.5 - 5.1 mmol/L  ? Chloride 105 98 - 111 mmol/L  ? BUN 14 6 - 20 mg/dL  ? Creatinine, Ser 1.00 0.61 - 1.24 mg/dL  ? Glucose, Bld 91 70 - 99 mg/dL  ?  Comment: Glucose reference range applies only to samples taken after fasting for at least 8 hours.  ? Calcium, Ion 1.11 (L) 1.15 - 1.40 mmol/L  ? TCO2 26 22 - 32 mmol/L  ? Hemoglobin 14.6 13.0 - 17.0 g/dL  ? HCT 43.0 39.0 - 52.0 %  ?CBC     Status: None  ? Collection Time: 01/30/22  9:00 PM  ?Result Value Ref Range  ? WBC 6.5 4.0 - 10.5 K/uL  ? RBC 4.64 4.22 - 5.81 MIL/uL  ? Hemoglobin 14.9 13.0 -  17.0 g/dL  ? HCT 41.5 39.0 - 52.0 %  ? MCV 89.4 80.0 - 100.0 fL  ? MCH 32.1 26.0 - 34.0 pg  ? MCHC 35.9 30.0 - 36.0 g/dL  ? RDW 12.5 11.5 - 15.5 %  ? Platelets 164 150 - 400 K/uL  ? nRBC 0.0 0.0 - 0.2 %  ?  Comment: Performed at [facility], [place] [address], [place], [place] [address]  ?Ethanol     Status: None  ? Collection Time: 01/30/22  9:00 PM  ?Result Value Ref Range  ? Alcohol, Ethyl (B) <10 <10 mg/dL  ?  Comment: (NOTE) ?Lowest detectable limit for serum alcohol is 10 mg/dL. ? ?For medical purposes only. ?Performed at [facility], [place] [address] [place], [place] ?[address] ?  ?Lactic acid, plasma     Status: None  ? Collection Time: 01/30/22  9:00 PM  ?Result Value Ref Range  ? Lactic Acid, Venous 1.2 0.5 - 1.9 mmol/L  ?  Comment: Performed at [facility], [place] [address], [place], [place] [address]  ?Sample to Blood Bank     Status: None  ? Collection Time: 01/30/22  9:00 PM  ?Result Value Ref Range  ? Blood Bank Specimen SAMPLE AVAILABLE FOR TESTING   ? Sample Expiration    ?  01/31/2022,2359 ?Performed  at [facility], [place] [address], [place], [place] [address] ?  ?Resp Panel by RT-PCR (Flu A&B, Covid) Nasopharyngeal Swab     Status: None  ? Collection Time: 01/30/22  9:57 PM  ? Specimen: Nasopharyngeal Swab; Nasopharyngeal(NP) swabs in vial transport medium  ?Result Value Ref Range  ? SARS Coronavirus 2 by RT PCR NEGATIVE NEGATIVE  ?  Comment: (NOTE) ?SARS-CoV-2 target nucleic acids are NOT DETECTED. ? ?The SARS-CoV-2 RNA is generally detectable in upper respiratory ?specimens during the acute phase of infection. The lowest ?concentration of SARS-CoV-2 viral copies this assay can detect is ?138 copies/mL. A negative result does not preclude SARS-Cov-2 ?infection and should not be used as the sole basis for treatment or ?other patient management decisions. A negative result may occur with  ?improper specimen collection/handling, submission of specimen other ?than nasopharyngeal swab, presence of viral mutation(s) within the ?areas targeted by this assay, and inadequate number of viral ?copies(<138 copies/mL). A negative result must be combined with ?clinical observations, patient history, and epidemiological ?information. The expected result is Negative. ? ?Fact Sheet for Patients:  ?EntrepreneurPulse.com.au ? ?Fact Sheet for Healthcare Providers:  ?IncredibleEmployment.be ? ?This test is no t yet approved or cleared by the [place] FDA and  ?has been authorized for detection and/or diagnosis of SARS-CoV-2 by ?FDA under an Emergency Use Authorization (EUA). This EUA will remain  ?in effect (meaning this test can be used) for the duration of the ?COVID-19 declaration under Section 564(b)(1) of the Act, 21 ?U.S.C.section 360bbb-3(b)(1), unless the authorization is terminated  ?or revoked sooner.  ? ? ?  ? Influenza A by PCR NEGATIVE NEGATIVE  ? Influenza B by PCR NEGATIVE NEGATIVE  ?  Comment: (NOTE) ?The Xpert Xpress SARS-CoV-2/FLU/RSV plus assay is intended as an  aid ?in the diagnosis of influenza from Nasopharyngeal swab specimens and ?should not be used as a sole basis for treatment. Nasal washings and ?aspirates are unacceptable for Xpert Xpress SARS-CoV-2/FLU/RSV ?testing. ? ?Fact Sheet for Patients: ?EntrepreneurPulse.com.au ? ?Fact Sheet for Healthcare Providers: ?IncredibleEmployment.be ? ?This test is not yet approved or cleared by the [place] and ?  has been authorized for detection and/or diagnosis of SARS-CoV-2 by ?FDA under an Emergency Use Authorization (EUA). This EUA will remain ?in effect (meaning this test can be used) for the duration of the ?COVID-19 declaration under Section 564(b)(1) of the Act, 21 U.S.C. ?section 360bbb-3(b)(1), unless the authorization is terminated or ?revoked. ? ?Performed at [facility], [place] [address], [place], [place] ?[address] ?  ? ? ?CT ANGIO HEAD NECK W WO CM ? ?Result Date: 01/30/2022 ?CLINICAL DATA:  Trauma with neck pain and lower extremity paralysis EXAM: CT ANGIOGRAPHY HEAD AND NECK TECHNIQUE: Multidetector CT imaging of the head and neck was performed using the standard protocol during bolus administration of intravenous contrast. Multiplanar CT image reconstructions and MIPs were obtained to evaluate the vascular anatomy. Carotid stenosis measurements (when applicable) are obtained utilizing NASCET criteria, using the distal internal carotid diameter as the denominator. RADIATION DOSE REDUCTION: This exam was performed according to the departmental dose-optimization program which includes automated exposure control, adjustment of the mA and/or kV according to patient size and/or use of iterative reconstruction technique. CONTRAST:  174mL OMNIPAQUE IOHEXOL 350 MG/ML SOLN COMPARISON:  CT cervical spine same day FINDINGS: CTA NECK FINDINGS SKELETON: Severe cervical spine injury which is also described on the concomitant cervical spine CT. There are bilateral C4 lamina  fractures. There are bilateral C5 lamina fractures with complete separation of the posterior neural arch. There are jumped facets at C5-6 with grade 3 anterolisthesis. There is a canal hematoma. OTHER NECK: Normal p

## 2022-01-30 NOTE — ED Notes (Signed)
Wife Caleb Rose is now here.  She is being updated.   ?

## 2022-01-30 NOTE — ED Notes (Signed)
Pt reports last PO intake was at 5pm. ?

## 2022-01-30 NOTE — ED Triage Notes (Signed)
Per EMS, pt from work, had a 1000LB pallet fall on his head.  It did not crush or trap him but it did hit his head "right between the eyes."   No LOC but describes the pain a 20/10.  Pt states that while he had feeling on all extremities he can not move his lower extremities or his arms.  Also reports some SOB however O2 stats were WNL but 2L Alda placed for comfort. ? ?18G bilateral AC IVs placed ?87mcg/mg of fentanyl given ?

## 2022-01-30 NOTE — ED Notes (Signed)
Back from CT.  Pt was able to notify [name] [phone] w/ the assistance of RN.   ? ?Provider bedside ?

## 2022-01-30 NOTE — Consult Note (Signed)
?Chief Complaint  ? ?Chief Complaint  ?Patient presents with  ? Trauma  ?  Level 2 trauma ?  ? ? ?History of Present Illness  ?Caleb Rose is a 41 y.o. 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 Pt c/o severe neck pain and difficulty moving legs more than arms as well as feeling "tingling" all over his body.  ? ?Of note, pt denies any significant medical history. No current rx meds, no allergies, no prior surgeries. Current tobacco smoker. ? ?Past Medical History  ?History reviewed. No pertinent past medical history. ? ?Past Surgical History  ?History reviewed. No pertinent surgical history. ? ?Social History  ? ?Social History  ? ?Tobacco Use  ? Smoking status: Every Day  ?  Packs/day: 0.50  ?  Types: Cigarettes  ? Smokeless tobacco: Never  ? Tobacco comments:  ?  less than 1/2 pack a day  ?Vaping Use  ? Vaping Use: Never used  ?Substance Use Topics  ? Alcohol use: Yes  ?  Comment: OCC- pt reports one drink tonight  ? Drug use: Yes  ?  Types: Marijuana  ? ? ?Medications  ? ?Prior to Admission medications   ?Medication Sig Start Date End Date Taking? Authorizing Provider  ?amoxicillin (AMOXIL) 500 MG capsule Take 1 capsule (500 mg total) by mouth 3 (three) times daily. For infection 10/30/18   Armandina Stammer I, NP  ?FLUoxetine (PROZAC) 20 MG capsule Take 1 capsule (20 mg total) by mouth daily. For depression 10/31/18   Armandina Stammer I, NP  ?hydrOXYzine (ATARAX/VISTARIL) 25 MG tablet Take 1 tablet (25 mg total) by mouth 3 (three) times daily as needed for anxiety. 10/30/18   Armandina Stammer I, NP  ?nicotine polacrilex (NICORETTE) 2 MG gum Take 1 each (2 mg total) by mouth as needed for smoking cessation. (May buy frpm over the counter): For smoking cessation 10/30/18   Armandina Stammer I, NP  ?traZODone (DESYREL) 150 MG tablet Take 1 tablet (150 mg total) by mouth at bedtime. For sleep 10/30/18   Armandina Stammer I, NP  ? ? ?Allergies  ?No Known  Allergies ? ?Review of Systems  ?ROS ? ?Neurologic Exam  ?Awake, alert, oriented ?Memory and concentration grossly intact ?Speech fluent, appropriate ?CN grossly intact ?Motor exam: ?Upper Extremities Deltoid Bicep Tricep Grip  ?Right 5/5 4+/5 4+/5 2/5  ?Left 5/5 4+/5 4+/5 2/5  ? ?Lower Extremities IP Quad PF DF EHL  ?Right 0/5 0/5 0/5 0/5 0/5  ?Left 0/5 0/5 0/5 0/5 0/5  ? ?Sensation grossly intact to LT BUE/BLE ? ?Imaging  ?CT head reviewed, no ICH noted. No skull fracture seen. No HCP. ?CT Cspine reveals bilateral locked facets at C5-6. There is laminar fracture at C4. There is associated grade 2 anterolisthesis C5 on C6 and spinal canal stenosis ?CT T/L spine is largely unremarkable for acute injury. ?CTA head/neck reviewed and does not reveal evidence of vertebral or carotid dissection. ? ?Impression  ?- 42 y.o. 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 No other significant injuries on primary survey. Cervical fracture is unstable and will require operative reduction/stabilization.  ? ?Plan  ?- Cont supportive care - goal MAP > ?- Stat MRI C-spine without contrast to eval for extent of hematoma - will dictate need / extent of possible posterior decompression ?- Will need operative stabilization of fracture/sublux,  plan on initial C5-6 ACDF with attempt to reduce sublux, possible need for posterior decompression/reduction/stabilization. ? ?I have reviewed the imaging findings with the patient and his family at bedside. I recommended surgical stabilization as above. I reviewed with them the details of the ACDF and posterior surgeries as well as the expected postop course. We reviewed the timeframe for recovery from SCI. We also discussed risks of surgery being worsening neurologic condition, bleeding, stroke, infection, CSF leak, and need for additional surgeries. All their questions were answered and the patient provided verbal consent to  proceed as above. ? ? ?Lisbeth Renshaw, MD ?[facility]  ? ?

## 2022-01-30 NOTE — ED Notes (Signed)
Called MRI, informed them of stat order.  Was instructed to bring in 10 minutes. ?

## 2022-01-30 NOTE — ED Notes (Signed)
Pt lost 18G L AC at CT.  Pt has fluids running through 18G in Right AC ?

## 2022-01-31 ENCOUNTER — Other Ambulatory Visit: Payer: Self-pay

## 2022-01-31 ENCOUNTER — Encounter (HOSPITAL_COMMUNITY): Payer: Self-pay | Admitting: Neurosurgery

## 2022-01-31 DIAGNOSIS — G8250 Quadriplegia, unspecified: Secondary | ICD-10-CM | POA: Diagnosis present

## 2022-01-31 DIAGNOSIS — W228XXA Striking against or struck by other objects, initial encounter: Secondary | ICD-10-CM | POA: Diagnosis present

## 2022-01-31 DIAGNOSIS — R339 Retention of urine, unspecified: Secondary | ICD-10-CM | POA: Diagnosis not present

## 2022-01-31 DIAGNOSIS — S12500A Unspecified displaced fracture of sixth cervical vertebra, initial encounter for closed fracture: Secondary | ICD-10-CM | POA: Diagnosis present

## 2022-01-31 DIAGNOSIS — R0981 Nasal congestion: Secondary | ICD-10-CM | POA: Diagnosis present

## 2022-01-31 DIAGNOSIS — S12400A Unspecified displaced fracture of fifth cervical vertebra, initial encounter for closed fracture: Secondary | ICD-10-CM | POA: Diagnosis present

## 2022-01-31 DIAGNOSIS — Y9259 Other trade areas as the place of occurrence of the external cause: Secondary | ICD-10-CM | POA: Diagnosis not present

## 2022-01-31 DIAGNOSIS — R319 Hematuria, unspecified: Secondary | ICD-10-CM | POA: Diagnosis not present

## 2022-01-31 DIAGNOSIS — F17210 Nicotine dependence, cigarettes, uncomplicated: Secondary | ICD-10-CM | POA: Diagnosis present

## 2022-01-31 DIAGNOSIS — S14104A Unspecified injury at C4 level of cervical spinal cord, initial encounter: Secondary | ICD-10-CM | POA: Diagnosis present

## 2022-01-31 DIAGNOSIS — Y990 Civilian activity done for income or pay: Secondary | ICD-10-CM | POA: Diagnosis not present

## 2022-01-31 DIAGNOSIS — Z20822 Contact with and (suspected) exposure to covid-19: Secondary | ICD-10-CM | POA: Diagnosis present

## 2022-01-31 DIAGNOSIS — R509 Fever, unspecified: Secondary | ICD-10-CM | POA: Diagnosis not present

## 2022-01-31 DIAGNOSIS — M62838 Other muscle spasm: Secondary | ICD-10-CM | POA: Diagnosis not present

## 2022-01-31 DIAGNOSIS — S14109A Unspecified injury at unspecified level of cervical spinal cord, initial encounter: Secondary | ICD-10-CM | POA: Diagnosis present

## 2022-01-31 DIAGNOSIS — Z9889 Other specified postprocedural states: Secondary | ICD-10-CM

## 2022-01-31 DIAGNOSIS — F430 Acute stress reaction: Secondary | ICD-10-CM | POA: Diagnosis present

## 2022-01-31 LAB — BASIC METABOLIC PANEL
Anion gap: 7 (ref 5–15)
BUN: 11 mg/dL (ref 6–20)
CO2: 22 mmol/L (ref 22–32)
Calcium: 8.8 mg/dL — ABNORMAL LOW (ref 8.9–10.3)
Chloride: 110 mmol/L (ref 98–111)
Creatinine, Ser: 1.12 mg/dL (ref 0.61–1.24)
GFR, Estimated: >60 mL/min (ref >60)
Glucose, Bld: 187 mg/dL — ABNORMAL HIGH (ref 70–99)
Potassium: 4.3 mmol/L (ref 3.5–5.1)
Sodium: 139 mmol/L (ref 135–145)

## 2022-01-31 LAB — CBC
HCT: 40.2 % (ref 39.0–52.0)
Hemoglobin: 13.6 g/dL (ref 13.0–17.0)
MCH: 31.2 pg (ref 26.0–34.0)
MCHC: 33.8 g/dL (ref 30.0–36.0)
MCV: 92.2 fL (ref 80.0–100.0)
Platelets: 170 10*3/uL (ref 150–400)
RBC: 4.36 MIL/uL (ref 4.22–5.81)
RDW: 12.7 % (ref 11.5–15.5)
WBC: 12.4 10*3/uL — ABNORMAL HIGH (ref 4.0–10.5)
nRBC: 0 % (ref 0.0–0.2)

## 2022-01-31 LAB — PROTIME-INR
INR: 1.2 (ref 0.8–1.2)
Prothrombin Time: 15 seconds (ref 11.4–15.2)

## 2022-01-31 LAB — HIV ANTIBODY (ROUTINE TESTING W REFLEX): HIV Screen 4th Generation wRfx: NONREACTIVE

## 2022-01-31 LAB — MRSA NEXT GEN BY PCR, NASAL: MRSA by PCR Next Gen: NOT DETECTED

## 2022-01-31 MED ORDER — ACETAMINOPHEN 500 MG PO TABS
1000.0000 mg | ORAL_TABLET | Freq: Four times a day (QID) | ORAL | Status: DC
  Administered 2022-01-31 – 2022-02-15 (×58): 1000 mg via ORAL
  Filled 2022-01-31 (×60): qty 2

## 2022-01-31 MED ORDER — HYDROMORPHONE HCL 1 MG/ML IJ SOLN
0.5000 mg | INTRAMUSCULAR | Status: DC | PRN
  Administered 2022-01-31: 0.5 mg via INTRAVENOUS
  Administered 2022-01-31 – 2022-02-07 (×18): 1 mg via INTRAVENOUS
  Filled 2022-01-31 (×19): qty 1

## 2022-01-31 MED ORDER — HYDROMORPHONE HCL 1 MG/ML IJ SOLN
0.2500 mg | INTRAMUSCULAR | Status: DC | PRN
  Administered 2022-01-31 (×2): 0.25 mg via INTRAVENOUS
  Administered 2022-01-31: 0.5 mg via INTRAVENOUS

## 2022-01-31 MED ORDER — ARTIFICIAL TEARS OPHTHALMIC OINT
TOPICAL_OINTMENT | OPHTHALMIC | Status: DC | PRN
  Administered 2022-01-31: 1 via OPHTHALMIC

## 2022-01-31 MED ORDER — LACTATED RINGERS IV SOLN: INTRAVENOUS | Status: DC | PRN

## 2022-01-31 MED ORDER — GABAPENTIN 300 MG PO CAPS
300.0000 mg | ORAL_CAPSULE | Freq: Two times a day (BID) | ORAL | Status: DC
  Administered 2022-01-31 – 2022-02-09 (×19): 300 mg via ORAL
  Filled 2022-01-31 (×19): qty 1

## 2022-01-31 MED ORDER — BUPIVACAINE HCL 0.5 % IJ SOLN
INTRAMUSCULAR | Status: DC | PRN
  Administered 2022-01-31: 3.5 mL

## 2022-01-31 MED ORDER — TRAZODONE HCL 50 MG PO TABS: 150.0000 mg | ORAL_TABLET | Freq: Every day | ORAL | Status: DC

## 2022-01-31 MED ORDER — PROPOFOL 10 MG/ML IV BOLUS
INTRAVENOUS | Status: AC
  Filled 2022-01-31: qty 20

## 2022-01-31 MED ORDER — FLUOXETINE HCL 20 MG PO CAPS: 20.0000 mg | ORAL_CAPSULE | Freq: Every day | ORAL | Status: DC

## 2022-01-31 MED ORDER — THROMBIN 20000 UNITS EX SOLR
CUTANEOUS | Status: DC | PRN
  Administered 2022-01-31: 20 mL via TOPICAL

## 2022-01-31 MED ORDER — PHENYLEPHRINE HCL (PRESSORS) 10 MG/ML IV SOLN
INTRAVENOUS | Status: DC | PRN
  Administered 2022-01-31: 80 ug via INTRAVENOUS

## 2022-01-31 MED ORDER — ONDANSETRON HCL 4 MG/2ML IJ SOLN
INTRAMUSCULAR | Status: DC | PRN
  Administered 2022-01-31: 4 mg via INTRAVENOUS

## 2022-01-31 MED ORDER — 0.9 % SODIUM CHLORIDE (POUR BTL) OPTIME
TOPICAL | Status: DC | PRN
  Administered 2022-01-31: 1000 mL

## 2022-01-31 MED ORDER — SUCCINYLCHOLINE CHLORIDE 200 MG/10ML IV SOSY
PREFILLED_SYRINGE | INTRAVENOUS | Status: DC | PRN
  Administered 2022-01-31: 120 mg via INTRAVENOUS

## 2022-01-31 MED ORDER — KCL IN DEXTROSE-NACL 20-5-0.45 MEQ/L-%-% IV SOLN
INTRAVENOUS | Status: DC
  Filled 2022-01-31: qty 1000

## 2022-01-31 MED ORDER — AMISULPRIDE (ANTIEMETIC) 5 MG/2ML IV SOLN: 10.0000 mg | Freq: Once | INTRAVENOUS | Status: DC | PRN

## 2022-01-31 MED ORDER — LIDOCAINE HCL (CARDIAC) PF 50 MG/5ML IV SOSY
PREFILLED_SYRINGE | INTRAVENOUS | Status: DC | PRN
Start: 2022-01-31 — End: 2022-01-31
  Administered 2022-01-31: 50 mg via INTRAVENOUS

## 2022-01-31 MED ORDER — BACITRACIN ZINC 500 UNIT/GM EX OINT
TOPICAL_OINTMENT | CUTANEOUS | Status: AC
  Filled 2022-01-31: qty 28.35

## 2022-01-31 MED ORDER — PROPOFOL 10 MG/ML IV BOLUS
INTRAVENOUS | Status: DC | PRN
Start: 2022-01-31 — End: 2022-01-31
  Administered 2022-01-31: 140 mg via INTRAVENOUS

## 2022-01-31 MED ORDER — OXYCODONE HCL 5 MG PO TABS
5.0000 mg | ORAL_TABLET | ORAL | Status: DC | PRN
  Administered 2022-01-31 – 2022-02-04 (×15): 10 mg via ORAL
  Administered 2022-02-04: 5 mg via ORAL
  Administered 2022-02-05 – 2022-02-15 (×43): 10 mg via ORAL
  Filled 2022-01-31 (×52): qty 2
  Filled 2022-01-31: qty 1
  Filled 2022-01-31 (×6): qty 2

## 2022-01-31 MED ORDER — HYDROMORPHONE HCL 1 MG/ML IJ SOLN: 0.5000 mg | INTRAMUSCULAR | Status: DC | PRN

## 2022-01-31 MED ORDER — PHENYLEPHRINE HCL-NACL 20-0.9 MG/250ML-% IV SOLN
INTRAVENOUS | Status: DC | PRN
  Administered 2022-01-31: 50 ug/min via INTRAVENOUS

## 2022-01-31 MED ORDER — LIDOCAINE-EPINEPHRINE 1 %-1:100000 IJ SOLN
INTRAMUSCULAR | Status: DC | PRN
  Administered 2022-01-31: 3.5 mL via INTRADERMAL

## 2022-01-31 MED ORDER — NICOTINE POLACRILEX 2 MG MT GUM
2.0000 mg | CHEWING_GUM | OROMUCOSAL | Status: DC | PRN
  Administered 2022-02-02: 2 mg via ORAL
  Filled 2022-01-31: qty 1

## 2022-01-31 MED ORDER — DOCUSATE SODIUM 100 MG PO CAPS
100.0000 mg | ORAL_CAPSULE | Freq: Two times a day (BID) | ORAL | Status: DC
  Administered 2022-01-31 – 2022-02-14 (×28): 100 mg via ORAL
  Filled 2022-01-31 (×29): qty 1

## 2022-01-31 MED ORDER — ACETAMINOPHEN 325 MG PO TABS: 650.0000 mg | ORAL_TABLET | ORAL | Status: DC | PRN

## 2022-01-31 MED ORDER — MEPERIDINE HCL 25 MG/ML IJ SOLN: 6.2500 mg | INTRAMUSCULAR | Status: DC | PRN

## 2022-01-31 MED ORDER — THROMBIN 5000 UNITS EX SOLR
OROMUCOSAL | Status: DC | PRN
  Administered 2022-01-31: 5 mL via TOPICAL

## 2022-01-31 MED ORDER — METHOCARBAMOL 500 MG PO TABS
1000.0000 mg | ORAL_TABLET | Freq: Three times a day (TID) | ORAL | Status: DC
  Administered 2022-01-31 – 2022-02-03 (×9): 1000 mg via ORAL
  Filled 2022-01-31 (×9): qty 2

## 2022-01-31 MED ORDER — ONDANSETRON 4 MG PO TBDP: 4.0000 mg | ORAL_TABLET | Freq: Four times a day (QID) | ORAL | Status: DC | PRN

## 2022-01-31 MED ORDER — FENTANYL CITRATE (PF) 100 MCG/2ML IJ SOLN
INTRAMUSCULAR | Status: DC | PRN
  Administered 2022-01-31: 100 ug via INTRAVENOUS
  Administered 2022-01-31 (×2): 25 ug via INTRAVENOUS

## 2022-01-31 MED ORDER — ONDANSETRON HCL 4 MG/2ML IJ SOLN: 4.0000 mg | Freq: Four times a day (QID) | INTRAMUSCULAR | Status: DC | PRN

## 2022-01-31 MED ORDER — DEXAMETHASONE SODIUM PHOSPHATE 4 MG/ML IJ SOLN
INTRAMUSCULAR | Status: DC | PRN
  Administered 2022-01-31: 10 mg via INTRAVENOUS

## 2022-01-31 MED ORDER — MIDAZOLAM HCL 5 MG/5ML IJ SOLN
INTRAMUSCULAR | Status: DC | PRN
  Administered 2022-01-31: 1 mg via INTRAVENOUS

## 2022-01-31 MED ORDER — ACETAMINOPHEN 10 MG/ML IV SOLN: 1000.0000 mg | Freq: Once | INTRAVENOUS | Status: DC | PRN

## 2022-01-31 MED ORDER — MELATONIN 3 MG PO TABS
3.0000 mg | ORAL_TABLET | Freq: Every evening | ORAL | Status: DC | PRN
  Administered 2022-02-01 – 2022-02-14 (×9): 3 mg via ORAL
  Filled 2022-01-31 (×9): qty 1

## 2022-01-31 MED ORDER — CHLORHEXIDINE GLUCONATE CLOTH 2 % EX PADS
6.0000 | MEDICATED_PAD | Freq: Every day | CUTANEOUS | Status: DC
  Administered 2022-01-31 – 2022-02-15 (×14): 6 via TOPICAL

## 2022-01-31 MED ORDER — DOUBLE ANTIBIOTIC 500-10000 UNIT/GM EX OINT
TOPICAL_OINTMENT | CUTANEOUS | Status: AC
  Filled 2022-01-31: qty 28.4

## 2022-01-31 MED ORDER — SUGAMMADEX SODIUM 200 MG/2ML IV SOLN
INTRAVENOUS | Status: DC | PRN
  Administered 2022-01-31: 200 mg via INTRAVENOUS

## 2022-01-31 MED ORDER — SODIUM CHLORIDE 0.9 % IV BOLUS
1000.0000 mL | Freq: Once | INTRAVENOUS | Status: AC
  Administered 2022-01-31: 1000 mL via INTRAVENOUS

## 2022-01-31 MED ORDER — HYDROMORPHONE HCL 1 MG/ML IJ SOLN
INTRAMUSCULAR | Status: AC
  Filled 2022-01-31: qty 1

## 2022-01-31 MED ORDER — DEXTROSE 5 % IV SOLN
500.0000 mg | Freq: Three times a day (TID) | INTRAVENOUS | Status: DC | PRN
  Administered 2022-01-31: 500 mg via INTRAVENOUS
  Filled 2022-01-31: qty 500
  Filled 2022-01-31: qty 5

## 2022-01-31 MED ORDER — LACTATED RINGERS IV SOLN: INTRAVENOUS | Status: DC

## 2022-01-31 MED ORDER — PROCHLORPERAZINE MALEATE 10 MG PO TABS: 10.0000 mg | ORAL_TABLET | Freq: Four times a day (QID) | ORAL | Status: DC | PRN

## 2022-01-31 MED ORDER — ROCURONIUM BROMIDE 100 MG/10ML IV SOLN
INTRAVENOUS | Status: DC | PRN
Start: 2022-01-31 — End: 2022-01-31
  Administered 2022-01-31: 10 mg via INTRAVENOUS
  Administered 2022-01-31: 40 mg via INTRAVENOUS
  Administered 2022-01-31: 30 mg via INTRAVENOUS
  Administered 2022-01-31: 20 mg via INTRAVENOUS

## 2022-01-31 MED ORDER — BACITRACIN ZINC 500 UNIT/GM EX OINT
TOPICAL_OINTMENT | CUTANEOUS | Status: DC | PRN
  Administered 2022-01-31: 1 via TOPICAL

## 2022-01-31 MED ORDER — METHOCARBAMOL 500 MG PO TABS: 1000.0000 mg | ORAL_TABLET | Freq: Three times a day (TID) | ORAL | Status: DC

## 2022-01-31 MED ORDER — ACETAMINOPHEN 325 MG PO TABS: 325.0000 mg | ORAL_TABLET | Freq: Once | ORAL | Status: DC | PRN

## 2022-01-31 MED ORDER — HYDROXYZINE HCL 25 MG PO TABS
25.0000 mg | ORAL_TABLET | Freq: Three times a day (TID) | ORAL | Status: DC | PRN
  Administered 2022-02-01 – 2022-02-05 (×6): 25 mg via ORAL
  Filled 2022-01-31 (×6): qty 1

## 2022-01-31 MED ORDER — ACETAMINOPHEN 160 MG/5ML PO SOLN: 325.0000 mg | Freq: Once | ORAL | Status: DC | PRN

## 2022-01-31 MED ORDER — CEFAZOLIN SODIUM-DEXTROSE 2-3 GM-%(50ML) IV SOLR
INTRAVENOUS | Status: DC | PRN
  Administered 2022-01-31: 2 g via INTRAVENOUS

## 2022-01-31 MED ORDER — PROCHLORPERAZINE EDISYLATE 10 MG/2ML IJ SOLN: 5.0000 mg | Freq: Four times a day (QID) | INTRAMUSCULAR | Status: DC | PRN

## 2022-01-31 NOTE — H&P (Signed)
?Reason for Consult:spinal cord injury ?Referring Physician: Rancour ?  ?Caleb Rose is an 41 y.o. male.  ?HPI: Pt is a 41 yo M brought to the Ed as a level 2 trauma with suspected spinal cord injury.  He works in a [employer].  He was lifting something off a palate and while doing so, something fell around 1 story and struck him directly on top of the head.  He fell to the ground. He immediately had neck pain.  He felt weak and trauma was called.  The object was a pallet with goods on it that reportedly weighed 1000 pounds.   ?  ?He complains of tingling in the extremities.  He denies SOB.  ?  ?History reviewed. No pertinent past medical history. ?  ?History reviewed. No pertinent surgical history. ?  ?History reviewed. No pertinent family history. ?  ?Social History:  reports that he has been smoking cigarettes. He has been smoking an average of .5 packs per day. He has never used smokeless tobacco. He reports current alcohol use. He reports current drug use. Drug: Marijuana. ?  ?Allergies: No Known Allergies ?  ?Medications:  ?Active Medications  ?No outpatient medications have been marked as taking for the 01/30/22 encounter [facility](Hospital Encounter).  ?  ?  ?  ?  ?Lab Results Last 48 Hours  ?      ?Results for orders placed or performed during the hospital encounter of 01/30/22 (from the past 48 hour(s))  ?Comprehensive metabolic panel     Status: Abnormal  ?  Collection Time: 01/30/22  9:00 PM  ?Result Value Ref Range  ?  Sodium 142 135 - 145 mmol/L  ?  Potassium 4.2 3.5 - 5.1 mmol/L  ?  Chloride 110 98 - 111 mmol/L  ?  CO2 25 22 - 32 mmol/L  ?  Glucose, Bld 96 70 - 99 mg/dL  ?    Comment: Glucose reference range applies only to samples taken after fasting for at least 8 hours.  ?  BUN 13 6 - 20 mg/dL  ?  Creatinine, Ser 0.98 0.61 - 1.24 mg/dL  ?  Calcium 9.6 8.9 - 10.3 mg/dL  ?  Total Protein 6.4 (L) 6.5 - 8.1 g/dL  ?  Albumin 3.8 3.5 - 5.0 g/dL  ?  AST 29 15 - 41 U/L  ?  ALT 27 0 - 44 U/L  ?  Alkaline  Phosphatase 62 38 - 126 U/L  ?  Total Bilirubin 0.8 0.3 - 1.2 mg/dL  ?  GFR, Estimated >60 >60 mL/min  ?    Comment: (NOTE) ?Calculated using the CKD-EPI Creatinine Equation (2021) ?   ?  Anion gap 7 5 - 15  ?    Comment: Performed at [facility], [address] [place], [place] [address]  ?I-Stat Chem 8, ED     Status: Abnormal  ?  Collection Time: 01/30/22  9:00 PM  ?Result Value Ref Range  ?  Sodium 140 135 - 145 mmol/L  ?  Potassium 4.1 3.5 - 5.1 mmol/L  ?  Chloride 105 98 - 111 mmol/L  ?  BUN 14 6 - 20 mg/dL  ?  Creatinine, Ser 1.00 0.61 - 1.24 mg/dL  ?  Glucose, Bld 91 70 - 99 mg/dL  ?    Comment: Glucose reference range applies only to samples taken after fasting for at least 8 hours.  ?  Calcium, Ion 1.11 (L) 1.15 - 1.40 mmol/L  ?  TCO2  26 22 - 32 mmol/L  ?  Hemoglobin 14.6 13.0 - 17.0 g/dL  ?  HCT 43.0 39.0 - 52.0 %  ?CBC     Status: None  ?  Collection Time: 01/30/22  9:00 PM  ?Result Value Ref Range  ?  WBC 6.5 4.0 - 10.5 K/uL  ?  RBC 4.64 4.22 - 5.81 MIL/uL  ?  Hemoglobin 14.9 13.0 - 17.0 g/dL  ?  HCT 41.5 39.0 - 52.0 %  ?  MCV 89.4 80.0 - 100.0 fL  ?  MCH 32.1 26.0 - 34.0 pg  ?  MCHC 35.9 30.0 - 36.0 g/dL  ?  RDW 12.5 11.5 - 15.5 %  ?  Platelets 164 150 - 400 K/uL  ?  nRBC 0.0 0.0 - 0.2 %  ?    Comment: Performed at [facility], [address], [place], [place] [address]  ?Ethanol     Status: None  ?  Collection Time: 01/30/22  9:00 PM  ?Result Value Ref Range  ?  Alcohol, Ethyl (B) <10 <10 mg/dL  ?    Comment: (NOTE) ?Lowest detectable limit for serum alcohol is 10 mg/dL. ?  ?For medical purposes only. ?Performed at [facility], [address], [place], [place] ?[address] ?   ?Lactic acid, plasma     Status: None  ?  Collection Time: 01/30/22  9:00 PM  ?Result Value Ref Range  ?  Lactic Acid, Venous 1.2 0.5 - 1.9 mmol/L  ?    Comment: Performed at [facility], [address], [place], [place] [address]  ?Sample to Blood Bank     Status: None  ?  Collection Time: 01/30/22   9:00 PM  ?Result Value Ref Range  ?  Blood Bank Specimen SAMPLE AVAILABLE FOR TESTING    ?  Sample Expiration      ?    01/31/2022,2359 ?Performed at [facility], [address] [place], [place] [address] ?   ?Resp Panel by RT-PCR (Flu A&B, Covid) Nasopharyngeal Swab     Status: None  ?  Collection Time: 01/30/22  9:57 PM  ?  Specimen: Nasopharyngeal Swab; Nasopharyngeal(NP) swabs in vial transport medium  ?Result Value Ref Range  ?  SARS Coronavirus 2 by RT PCR NEGATIVE NEGATIVE  ?    Comment: (NOTE) ?SARS-CoV-2 target nucleic acids are NOT DETECTED. ?  ?The SARS-CoV-2 RNA is generally detectable in upper respiratory ?specimens during the acute phase of infection. The lowest ?concentration of SARS-CoV-2 viral copies this assay can detect is ?138 copies/mL. A negative result does not preclude SARS-Cov-2 ?infection and should not be used as the sole basis for treatment or ?other patient management decisions. A negative result may occur with  ?improper specimen collection/handling, submission of specimen other ?than nasopharyngeal swab, presence of viral mutation(s) within the ?areas targeted by this assay, and inadequate number of viral ?copies(<138 copies/mL). A negative result must be combined with ?clinical observations, patient history, and epidemiological ?information. The expected result is Negative. ?  ?Fact Sheet for Patients:  ?BloggerCourse.com ?  ?Fact Sheet for Healthcare Providers:  ?SeriousBroker.it ?  ?This test is no t yet approved or cleared by the [place] FDA and  ?has been authorized for detection and/or diagnosis of SARS-CoV-2 by ?FDA under an Emergency Use Authorization (EUA). This EUA will remain  ?in effect (meaning this test can be used) for the duration of the ?COVID-19 declaration under Section 564(b)(1) of the Act, 21 ?  U.S.C.section 360bbb-3(b)(1), unless the authorization is terminated  ?or revoked sooner.  ?  ?  ?   ?   Influenza A by PCR NEGATIVE NEGATIVE  ?  Influenza B by PCR NEGATIVE NEGATIVE  ?    Comment: (NOTE) ?The Xpert Xpress SARS-CoV-2/FLU/RSV plus assay is intended as an aid ?in the diagnosis of influenza from Nasopharyngeal swab specimens and ?should not be used as a sole basis for treatment. Nasal washings and ?aspirates are unacceptable for Xpert Xpress SARS-CoV-2/FLU/RSV ?testing. ?  ?Fact Sheet for Patients: ?BloggerCourse.com ?  ?Fact Sheet for Healthcare Providers: ?SeriousBroker.it ?  ?This test is not yet approved or cleared by the [place] FDA and ?has been authorized for detection and/or diagnosis of SARS-CoV-2 by ?FDA under an Emergency Use Authorization (EUA). This EUA will remain ?in effect (meaning this test can be used) for the duration of the ?COVID-19 declaration under Section 564(b)(1) of the Act, 21 U.S.C. ?section 360bbb-3(b)(1), unless the authorization is terminated or ?revoked. ?  ?Performed at [facility], [address], [place], [place] ?[address] ?   ?  ?  ?  ? ?Imaging Results (Last 48 hours)  ?CT ANGIO HEAD NECK W WO CM ?  ?Result Date: 01/30/2022 ?CLINICAL DATA:  Trauma with neck pain and lower extremity paralysis EXAM: CT ANGIOGRAPHY HEAD AND NECK TECHNIQUE: Multidetector CT imaging of the head and neck was performed using the standard protocol during bolus administration of intravenous contrast. Multiplanar CT image reconstructions and MIPs were obtained to evaluate the vascular anatomy. Carotid stenosis measurements (when applicable) are obtained utilizing NASCET criteria, using the distal internal carotid diameter as the denominator. RADIATION DOSE REDUCTION: This exam was performed according to the departmental dose-optimization program which includes automated exposure control, adjustment of the mA and/or kV according to patient size and/or use of iterative reconstruction technique. CONTRAST:  OMNIPAQUE IOHEXOL 350  MG/ML SOLN COMPARISON:  CT cervical spine same day FINDINGS: CTA NECK FINDINGS SKELETON: Severe cervical spine injury which is also described on the concomitant cervical spine CT. There are bilateral C4 lamina fractures.

## 2022-01-31 NOTE — Progress Notes (Signed)
Orthopedic Tech Progress Note ?Patient Details:  ?Caleb Rose ?[birth year] ?[id number] ? ?Patient ID: TAIT BALISTRERI, male   DOB: 1981/03/04, 41 y.o.   MRN: [id number] ?I attended trauma page. ?Trinna Post ?01/31/2022, 2:42 AM ? ?

## 2022-01-31 NOTE — Progress Notes (Addendum)
?  NEUROSURGERY PROGRESS NOTE  ? ?No issues overnight. Pt with significantly improved neck pain this am. No significant dysphagia. ? ?EXAM:  ?BP (!) 110/58 (BP Location: Right Arm)   Pulse 68   Temp 98.6 ?F (37 ?C) (Oral)   Resp 12   Ht 5\' 11"  (1.803 m)   Wt 78.6 kg   SpO2 96%   BMI 24.17 kg/m?  ? ?Awake, alert, oriented  ?Speech fluent, appropriate  ?CN grossly intact  ?Motor exam: ?Upper Extremities Deltoid Bicep Tricep Grip  ?Right 5/5 4+/5 4+/5 2/5  ?Left 5/5 4+/5 4+/5 2/5  ?  ?Lower Extremities IP Quad PF DF EHL  ?Right 0/5 0/5 0/5 0/5 0/5  ?Left 0/5 0/5 0/5 0/5 0/5  ? ? ?IMPRESSION:  ?41 y.o. 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 ?PLAN: ?- Cont supportive care ?- PT/OT evals. Suspect he would be a good CIR candidate ?- Can consider D/c Foley and begin CIC with high likelihood of neurogenic bladder. ?- Can start prophylactic lovenox tomorrow ? ? ?24, MD ?[facility]  ? ?
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
## 2022-01-31 NOTE — TOC CAGE-AID Note (Signed)
Transition of Care (TOC) - CAGE-AID Screening ? ? ?Patient Details  ?Name: EMMETTE KATT ?MRN: [id number] ?Date of Birth: Apr 23, 1981 ? ?Transition of Care (TOC) CM/SW Contact:    ?Draya Felker C Tarpley-Carter, LCSWA ?Phone Number: ?01/31/2022, 12:52 PM ? ? ?Clinical Narrative: ?Pt participated in Cage-Aid.  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  Pt was offered resources, due to usage of substance.    ? ?Insurance underwriter, MSW, LCSW-A ?Pronouns:  She/Her/Hers ?[facility] ?Clinical Social Worker ?Direct Number:  [phone] ?[email or web address] ? ?CAGE-AID Screening: ?  ? ?Have You Ever Felt You Ought to Cut Down on Your Drinking or Drug Use?: No ?Have People Annoyed You By Critizing Your Drinking Or Drug Use?: No ?Have You Felt Bad Or Guilty About Your Drinking Or Drug Use?: No ?Have You Ever Had a Drink or Used Drugs First Thing In The Morning to Steady Your Nerves or to Get Rid of a Hangover?: No ?CAGE-AID Score: 0 ? ?Substance Abuse Education Offered: Yes ? ?Substance abuse interventions: Educational Materials ? ? ? ? ? ? ?

## 2022-01-31 NOTE — Anesthesia Postprocedure Evaluation (Signed)
Anesthesia Post Note ? ?Patient: Caleb Rose ? ?Procedure(s) Performed: ANTERIOR CERVICAL DECOMPRESSION FUSION CERVICAL FIVE-SIX (Neck) ? ?  ? ?Patient location during evaluation: PACU ?Anesthesia Type: General ?Level of consciousness: awake and alert ?Pain management: pain level controlled ?Vital Signs Assessment: post-procedure vital signs reviewed and stable ?Respiratory status: spontaneous breathing, nonlabored ventilation, respiratory function stable and patient connected to nasal cannula oxygen ?Cardiovascular status: blood pressure returned to baseline and stable ?Postop Assessment: no apparent nausea or vomiting ?Anesthetic complications: no ? ? ?No notable events documented. ? ?Last Vitals:  ?Vitals:  ? 01/31/22 0700 01/31/22 0800  ?BP: (!) 104/58 (!) 110/58  ?Pulse: 69 68  ?Resp: 13 12  ?Temp:  37.1 ?C  ?SpO2: 99% 96%  ?  ?Last Pain:  ?Vitals:  ? 01/31/22 0800  ?TempSrc: Oral  ?PainSc: 9   ? ? ?  ?  ?  ?  ?  ?  ? ?Effie Berkshire ? ? ? ? ?

## 2022-01-31 NOTE — Progress Notes (Signed)
Seen and examined. Gross upper movement, no movement/sensation of BLE. Cont c-collar. Therapies. Txf to 4NP.  ? ?Jesusita Oka, MD ?General and Trauma Surgery ?[facility] ? ?
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
## 2022-01-31 NOTE — Progress Notes (Addendum)
?  NEUROSURGERY PROGRESS NOTE  ? ?I have reviewed the MRI cspine which again demonstrates the C5-6 fracture/subluxation and bilateral jumped facets. There is associated ALL/PLL and ligamentum flavum and interspinous disruption. There does appear to be some subtle T2/FLAIR hyperintensity within the spinal cord at approximately C6 level. There does appear to be a hematoma extending from C2 down to C6 ventrally which effaces the ventral CSF space, however it does not appear to deform the cord. I therefore do not feel the hematoma itself requires decompression beyond reduction of the fracture/sublux itself. ? ?Will plan on ACDF with attempt at reduction of the fracture, possible need for posterior approach if unable to reduce. ? ? ?Lisbeth Renshaw, MD ?[facility]  ? ?

## 2022-01-31 NOTE — Transfer of Care (Signed)
Immediate Anesthesia Transfer of Care Note ? ?Patient: Caleb Rose ? ?Procedure(s) Performed: ANTERIOR CERVICAL DECOMPRESSION FUSION CERVICAL FIVE-SIX (Neck) ? ?Patient Location: PACU ? ?Anesthesia Type:General ? ?Level of Consciousness: awake and alert  ? ?Airway & Oxygen Therapy: Patient Spontanous Breathing and Patient connected to nasal cannula oxygen ? ?Post-op Assessment: Report given to RN and Post -op Vital signs reviewed and stable ? ?Post vital signs: Reviewed and stable ? ?Last Vitals:  ?Vitals Value Taken Time  ?BP 120/82 01/31/22 0243  ?Temp    ?Pulse 79 01/31/22 0253  ?Resp 19 01/31/22 0253  ?SpO2 98 % 01/31/22 0253  ?Vitals shown include unvalidated device data. ? ?Last Pain:  ?Vitals:  ? 01/30/22 2050  ?TempSrc: Temporal  ?PainSc: 10-Worst pain ever  ?   ? ?  ? ?Complications: No notable events documented. ?

## 2022-01-31 NOTE — Anesthesia Procedure Notes (Addendum)
Procedure Name: Intubation ?Date/Time: 01/31/2022 12:04 AM ?Performed by: Edmonia Caprio, CRNA ?Pre-anesthesia Checklist: Patient identified, Emergency Drugs available, Suction available and Patient being monitored ?Patient Re-evaluated:Patient Re-evaluated prior to induction ?Oxygen Delivery Method: Circle system utilized ?Preoxygenation: Pre-oxygenation with 100% oxygen ?Induction Type: IV induction, Rapid sequence and Cricoid Pressure applied ?Laryngoscope Size: Glidescope and 4 ?Grade View: Grade I ?Tube type: Oral ?Tube size: 7.5 mm ?Number of attempts: 1 ?Airway Equipment and Method: Stylet and Video-laryngoscopy ?Placement Confirmation: ETT inserted through vocal cords under direct vision, positive ETCO2 and breath sounds checked- equal and bilateral ?Secured at: 21 cm ?Tube secured with: Tape ?Dental Injury: Teeth and Oropharynx as per pre-operative assessment  ?Difficulty Due To: Difficult Airway- due to cervical collar and Difficult Airway-  due to neck instability ?Comments: C spine stabilization throughout ? ? ? ? ?

## 2022-01-31 NOTE — Progress Notes (Signed)
Pacu RN Report to floor given ? ?Gave report to  [employer]. Room: 4N20.  ? ?Discussed surgery, meds given in OR and Pacu, VS, IV fluids given, EBL, urine output, pain and other pertinent information. Also discussed if pt had any family or friends here or belongings with them.  ? ?Discussed sensation, movement, cervical level of sensation, pain, VSS, foley cath and facial small wound near eyes and on arms.  ? ?Pt exits my care.   ?

## 2022-02-01 MED ORDER — BISACODYL 10 MG RE SUPP
10.0000 mg | Freq: Every day | RECTAL | Status: DC
Start: 2022-02-01 — End: 2022-02-15
  Administered 2022-02-01 – 2022-02-14 (×9): 10 mg via RECTAL
  Filled 2022-02-01 (×11): qty 1

## 2022-02-01 MED ORDER — ENOXAPARIN SODIUM 30 MG/0.3ML IJ SOSY
30.0000 mg | PREFILLED_SYRINGE | Freq: Two times a day (BID) | INTRAMUSCULAR | Status: DC
Start: 2022-02-01 — End: 2022-02-15
  Administered 2022-02-01 – 2022-02-15 (×28): 30 mg via SUBCUTANEOUS
  Filled 2022-02-01 (×28): qty 0.3

## 2022-02-01 MED ORDER — ENSURE ENLIVE PO LIQD
237.0000 mL | Freq: Two times a day (BID) | ORAL | Status: DC
  Administered 2022-02-01 – 2022-02-14 (×23): 237 mL via ORAL
  Filled 2022-02-01 (×4): qty 237
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
## 2022-02-01 NOTE — Progress Notes (Signed)
?  Transition of Care (TOC) Screening Note ? ? ?Patient Details  ?Name: Caleb Rose ?Date of Birth: [birth year] ? ? ?Transition of Care [facility]) CM/SW Contact:    ?Geralynn Ochs, LCSW ?Phone Number: ?02/01/2022, 12:39 PM ? ? ? ?Transition of Care Department [facility]) has reviewed patient and no TOC needs have been identified at this time; medical workup ongoing. We will continue to monitor patient advancement through interdisciplinary progression rounds. If new patient transition needs arise, please place a TOC consult. ?  ?
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
## 2022-02-01 NOTE — Progress Notes (Signed)
Inpatient Rehab Admissions Coordinator:  ° °Per therapy recommendations patient was screened for CIR candidacy by Tammi Boulier, MS, CCC-SLP. At this time, Pt. Appears to be a a potential candidate for CIR. I will place   order for rehab consult per protocol for full assessment. Please contact me any with questions. ° °Layni Kreamer, MS, CCC-SLP °Rehab Admissions Coordinator  °[phone] (celll) °[phone] (office) ° °
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
## 2022-02-01 NOTE — Progress Notes (Signed)
?  NEUROSURGERY PROGRESS NOTE  ? ?No issues overnight. No c/o this am. Reports being able to feel "pressure" in his feet. No real change in motor exam. ? ?EXAM:  ?BP (!) 106/56   Pulse 64   Temp 99.8 ?F (37.7 ?C) (Oral)   Resp 20   Ht 5\' 11"  (1.803 m)   Wt 78.6 kg   SpO2 95%   BMI 24.17 kg/m?  ? ?Awake, alert, oriented  ?Speech fluent, appropriate  ?CN grossly intact  ?Motor exam: ?Upper Extremities Deltoid Bicep Tricep Grip  ?Right 5/5 4+/5 4+/5 1/5  ?Left 5/5 4+/5 4+/5 1/5  ?  ?Lower Extremities IP Quad PF DF EHL  ?Right 0/5 0/5 0/5 0/5 0/5  ?Left 0/5 0/5 0/5 0/5 0/5  ? ? ?IMPRESSION:  ?40 y.o. male POD#2 C5-6 ORIF, stable neurologic exam with C7 quadriplegia ? ?PLAN: ?- Cont supportive care ?- Stable for CIR from neurosurgical standpoint ?- Can consider D/c Foley and begin CIC with high likelihood of neurogenic bladder. ?- OK for DVT prophylaxis today ? ? ?24, MD ?[facility]  ? ?
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
## 2022-02-02 NOTE — Progress Notes (Signed)
?  NEUROSURGERY PROGRESS NOTE  ? ?No issues overnight. No c/o this am. Reports some minimal LE movement today ? ?EXAM:  ?BP 116/62   Pulse 74   Temp 98.6 ?F (37 ?C) (Oral)   Resp 17   Ht 5\' 11"  (1.803 m)   Wt 78.6 kg   SpO2 90%   BMI 24.17 kg/m?  ? ?Awake, alert, oriented  ?Speech fluent, appropriate  ?CN grossly intact  ?Motor exam: ?Upper Extremities Deltoid Bicep Tricep Grip  ?Right 5/5 4+/5 4+/5 1/5  ?Left 5/5 4+/5 4+/5 1/5  ?  ?Lower Extremities IP Quad PF DF EHL  ?Right 0/5 0/5 0/5 0/5 0/5  ?Left 0/5 0/5 0/5 0/5 0/5  ? ?Has flicker of hip rotation today, subtly improved finger flex/ext ? ?IMPRESSION:  ?41 y.o. male POD#3 C5-6 ORIF, stable neurologic exam with C7 quadriparesis ? ?PLAN: ?- Cont supportive care ?- Stable for CIR from neurosurgical standpoint ? ? ?24, MD ?[facility]  ? ?
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
## 2022-02-02 NOTE — Progress Notes (Signed)
Pt transferred to 4NP-14. Pt c/o pain to neck 7/10. Assessment and VS documented. Pt GF at bedside. Oriented to unit. Call bell within reach, bed in lowest position, bed alarm on.  ?

## 2022-02-02 NOTE — Progress Notes (Signed)
?   02/02/22 1438  ?Assess: MEWS Score  ?Temp (!) 100.8 ?F (38.2 ?C)  ?BP 115/75  ?Pulse Rate 72  ?ECG Heart Rate 72  ?Resp (!) 21  ?SpO2 93 %  ?Assess: MEWS Score  ?MEWS Temp 1  ?MEWS Systolic 0  ?MEWS Pulse 0  ?MEWS RR 1  ?MEWS LOC 0  ?MEWS Score 2  ?MEWS Score Color Yellow  ?Assess: if the MEWS score is Yellow or Red  ?Were vital signs taken at a resting state? Yes  ?Focused Assessment No change from prior assessment  ?Early Detection of Sepsis Score *See Row Information* Low  ?MEWS guidelines implemented *See Row Information* Yes  ?Treat  ?MEWS Interventions Administered scheduled meds/treatments  ?Pain Scale 0-10  ?Pain Score 5  ?Take Vital Signs  ?Increase Vital Sign Frequency  Yellow: Q 2hr X 2 then Q 4hr X 2, if remains yellow, continue Q 4hrs  ?Escalate  ?MEWS: Escalate Yellow: discuss with charge nurse/RN and consider discussing with provider and RRT  ?Notify: Charge Nurse/RN  ?Name of Charge Nurse/RN Notified Donetta Potts RN  ?Date Charge Nurse/RN Notified 02/02/22  ?Time Charge Nurse/RN Notified 1500  ? ? ?
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
## 2022-02-02 NOTE — Progress Notes (Signed)
Inpatient Rehab Admissions Coordinator:  ? ? I met with Pt. Today to discuss potential CIR admit. He is interested, wife can provide support. They are going to measure home to see if it is wheelchair accessible and can build a ramp. Wife states worker's comp is on board. I will follow for potential admit pending bed availability, payor authorization, and confirmation that Pt. Will have an accessible disposition. ? ?Clemens Catholic, MS, CCC-SLP ?Rehab Admissions Coordinator  ?[phone] (celll) ?[phone] (office) ? ?

## 2022-02-02 NOTE — Progress Notes (Signed)
Orthopedic Tech Progress Note ?Patient Details:  ?Caleb Rose ?12-Oct-1980 ?[id number] ? ?Ortho Devices ?Type of Ortho Device: Prafo boot/shoe ?Ortho Device/Splint Location: BLE ?Ortho Device/Splint Interventions: Application, Adjustment, Ordered ?  ?Post Interventions ?Patient Tolerated: Well ?Instructions Provided: Care of device ? ?[name] ?02/02/2022, 2:44 PM ? ?

## 2022-02-03 LAB — CBC
HCT: 35.2 % — ABNORMAL LOW (ref 39.0–52.0)
Hemoglobin: 12.7 g/dL — ABNORMAL LOW (ref 13.0–17.0)
MCH: 32 pg (ref 26.0–34.0)
MCHC: 36.1 g/dL — ABNORMAL HIGH (ref 30.0–36.0)
MCV: 88.7 fL (ref 80.0–100.0)
Platelets: 140 10*3/uL — ABNORMAL LOW (ref 150–400)
RBC: 3.97 MIL/uL — ABNORMAL LOW (ref 4.22–5.81)
RDW: 11.9 % (ref 11.5–15.5)
WBC: 8.1 10*3/uL (ref 4.0–10.5)
nRBC: 0 % (ref 0.0–0.2)

## 2022-02-03 LAB — BASIC METABOLIC PANEL
Anion gap: 6 (ref 5–15)
BUN: 11 mg/dL (ref 6–20)
CO2: 28 mmol/L (ref 22–32)
Calcium: 8.6 mg/dL — ABNORMAL LOW (ref 8.9–10.3)
Chloride: 103 mmol/L (ref 98–111)
Creatinine, Ser: 0.93 mg/dL (ref 0.61–1.24)
GFR, Estimated: >60 mL/min (ref >60)
Glucose, Bld: 135 mg/dL — ABNORMAL HIGH (ref 70–99)
Potassium: 3.5 mmol/L (ref 3.5–5.1)
Sodium: 137 mmol/L (ref 135–145)

## 2022-02-03 MED ORDER — METHOCARBAMOL 500 MG PO TABS
1000.0000 mg | ORAL_TABLET | Freq: Four times a day (QID) | ORAL | Status: DC
  Administered 2022-02-03 – 2022-02-07 (×17): 1000 mg via ORAL
  Filled 2022-02-03 (×16): qty 2
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
## 2022-02-03 NOTE — Progress Notes (Signed)
?   02/03/22 1635  ?Assess: MEWS Score  ?Temp (!) 101.5 ?F (38.6 ?C)  ?BP 123/62  ?Pulse Rate 70  ?ECG Heart Rate 70  ?Resp (!) 21  ?SpO2 95 %  ?O2 Device Nasal Cannula  ?Assess: MEWS Score  ?MEWS Temp 2  ?MEWS Systolic 0  ?MEWS Pulse 0  ?MEWS RR 1  ?MEWS LOC 0  ?MEWS Score 3  ?MEWS Score Color Yellow  ?Assess: if the MEWS score is Yellow or Red  ?Were vital signs taken at a resting state? Yes  ?Focused Assessment No change from prior assessment  ?Early Detection of Sepsis Score *See Row Information* Low  ?MEWS guidelines implemented *See Row Information* Yes  ?Treat  ?MEWS Interventions Administered scheduled meds/treatments;Other (Comment) ?(Encouraged increased fluids)  ?Pain Scale 0-10  ?Pain Score 4  ?Take Vital Signs  ?Increase Vital Sign Frequency  Yellow: Q 2hr X 2 then Q 4hr X 2, if remains yellow, continue Q 4hrs  ?Escalate  ?MEWS: Escalate Yellow: discuss with charge nurse/RN and consider discussing with provider and RRT  ?Notify: Charge Nurse/RN  ?Name of Charge Nurse/RN Notified Donetta Potts RN  ?Date Charge Nurse/RN Notified 02/03/22  ?Time Charge Nurse/RN Notified 1640  ?Document  ?Progress note created (see row info) Yes  ? ? ?
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
## 2022-02-03 NOTE — TOC Progression Note (Signed)
Transition of Care (TOC) - Progression Note  ? ? ?Patient Details  ?Name: Caleb Rose ?MRN: [id number] ?Date of Birth: 07/12/1981 ? ?Transition of Care (TOC) CM/SW Contact  ?Amerson, Julie M, RN ?Phone Number: ?02/03/2022, 3:35 PM ? ?Clinical Narrative:    ?Met with patient to discuss discharge plans and Worker's Comp. follow-up.  Case manager is Sandy, phone [phone]; fax: [phone].  Patient gives permission for all clinical information to be shared with Worker's Comp. case manager.  Faxed clinical information, as requested. ? ? ?Expected Discharge Plan: IP Rehab Facility ?Barriers to Discharge: Continued Medical Work up ? ?Expected Discharge Plan and Services ?Expected Discharge Plan: IP Rehab Facility ?  ?Discharge Planning Services: CM Consult ?  ?Living arrangements for the past 2 months: Mobile Home ?                ?  ?  ?  ?  ?  ?  ?  ?  ?  ?  ? ? ?Social Determinants of Health (SDOH) Interventions ?  ? ?Readmission Risk Interventions ?   ? View : No data to display.  ?  ?  ?  ?Julie W. Amerson, RN, BSN  ?Trauma/Neuro ICU Case Manager ?[phone] ? ? ?

## 2022-02-03 NOTE — Progress Notes (Signed)
Inpatient Rehab Admissions Coordinator:  ? ?Met with pt, his brother, and significant other at bedside.  We discussed that I did receive approval from [facility] for CIR, but I also would like to pursue worker's comp if they are accepting the case.  Family has been working with them and provided me with contact information (CM is [place], phone [phone], fax [phone]).  I was able to speak with Lovey Newcomer and will connect her with Ellan Lambert, RN CM, so she is able to get the needed records to process his case.  She states that she will keep me posted on approval for CIR (needs MD recommendation stating CIR) and I let her know it would likely be early next week based on bed availability.  Will continue to follow.   ? ?Shann Medal, PT, DPT ?Admissions Coordinator ?[phone] ?02/03/22  ?1:20 PM ? ?

## 2022-02-03 NOTE — Progress Notes (Signed)
?  NEUROSURGERY PROGRESS NOTE  ? ?No issues overnight. No c/o today. ? ?EXAM:  ?BP 107/61 (BP Location: Right Arm)   Pulse (!) 58   Temp 99.5 ?F (37.5 ?C) (Axillary)   Resp 17   Ht 5\' 11"  (1.803 m)   Wt 78.6 kg   SpO2 93%   BMI 24.17 kg/m?  ? ?Awake, alert, oriented  ?Speech fluent, appropriate  ?CN grossly intact  ?Motor exam: ?Upper Extremities Deltoid Bicep Tricep Grip  ?Right 5/5 4+/5 4+/5 1/5  ?Left 5/5 4+/5 4+/5 1/5  ?  ?Lower Extremities IP Quad PF DF EHL  ?Right 0/5 0/5 0/5 0/5 0/5  ?Left 0/5 0/5 0/5 0/5 0/5  ? ?Has flicker of hip rotation today, subtle finger flex/ext ? ?IMPRESSION:  ?41 y.o. male POD#4 C5-6 ORIF, stable neurologic exam with C7 quadriparesis ? ?PLAN: ?- Cont supportive care ?- Stable for CIR from neurosurgical standpoint ? ? ?24, MD ?[facility]  ? ?

## 2022-02-04 ENCOUNTER — Inpatient Hospital Stay (HOSPITAL_COMMUNITY): Payer: Worker's Compensation

## 2022-02-04 LAB — URINALYSIS, COMPLETE (UACMP) WITH MICROSCOPIC
Bilirubin Urine: NEGATIVE
Glucose, UA: NEGATIVE mg/dL
Ketones, ur: NEGATIVE mg/dL
Leukocytes,Ua: NEGATIVE
Nitrite: NEGATIVE
Protein, ur: NEGATIVE mg/dL
Specific Gravity, Urine: 1.016 (ref 1.005–1.030)
pH: 5 (ref 5.0–8.0)

## 2022-02-04 MED ORDER — IBUPROFEN 200 MG PO TABS
600.0000 mg | ORAL_TABLET | Freq: Once | ORAL | Status: AC
  Administered 2022-02-04: 600 mg via ORAL
  Filled 2022-02-04: qty 3

## 2022-02-04 NOTE — Progress Notes (Signed)
?  NEUROSURGERY PROGRESS NOTE  ? ?No issues overnight. No c/o today. Reports improved movement of legs this am. ? ?EXAM:  ?BP 123/67 (BP Location: Left Arm)   Pulse 65   Temp 99.7 ?F (37.6 ?C) (Axillary)   Resp (!) 23   Ht 5\' 11"  (1.803 m)   Wt 78.6 kg   SpO2 97%   BMI 24.17 kg/m?  ? ?Awake, alert, oriented  ?Speech fluent, appropriate  ?CN grossly intact  ?Motor exam: ?Upper Extremities Deltoid Bicep Tricep Grip  ?Right 5/5 4+/5 4+/5 1/5  ?Left 5/5 4+/5 4+/5 1/5  ?  ?Lower Extremities IP Quad PF DF EHL  ?Right 0/5 0/5 1/5 1/5 0/5  ?Left 0/5 0/5 1/5 1/5 0/5  ? ?Has improved hip rotation today, now flicker PF/DF and subtle finger flex/ext ? ?IMPRESSION:  ?41 y.o. male POD#5 C5-6 ORIF, stable neurologic exam with C7 quadriparesis ? ?PLAN: ?- Cont supportive care ?- Stable for CIR from neurosurgical standpoint ? ? ?24, MD ?[facility]  ? ?
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
## 2022-02-04 NOTE — Progress Notes (Signed)
Inpatient Rehab Admissions Coordinator:  ? ?I do not have a CIR bed for this Pt. Today. Continue to wait on worker's comp case to be opened in order to get approval for admit. I will continue to follow for potential admit pending insurance auth.  ? ?Megan Salon, MS, CCC-SLP ?Rehab Admissions Coordinator  ?[phone] (celll) ?[phone] (office) ? ?

## 2022-02-05 LAB — BASIC METABOLIC PANEL
Anion gap: 7 (ref 5–15)
BUN: 12 mg/dL (ref 6–20)
CO2: 31 mmol/L (ref 22–32)
Calcium: 8.8 mg/dL — ABNORMAL LOW (ref 8.9–10.3)
Chloride: 101 mmol/L (ref 98–111)
Creatinine, Ser: 1.02 mg/dL (ref 0.61–1.24)
GFR, Estimated: >60 mL/min (ref >60)
Glucose, Bld: 121 mg/dL — ABNORMAL HIGH (ref 70–99)
Potassium: 3.6 mmol/L (ref 3.5–5.1)
Sodium: 139 mmol/L (ref 135–145)

## 2022-02-05 LAB — CBC
HCT: 35.5 % — ABNORMAL LOW (ref 39.0–52.0)
Hemoglobin: 12.4 g/dL — ABNORMAL LOW (ref 13.0–17.0)
MCH: 31.3 pg (ref 26.0–34.0)
MCHC: 34.9 g/dL (ref 30.0–36.0)
MCV: 89.6 fL (ref 80.0–100.0)
Platelets: 173 10*3/uL (ref 150–400)
RBC: 3.96 MIL/uL — ABNORMAL LOW (ref 4.22–5.81)
RDW: 11.9 % (ref 11.5–15.5)
WBC: 6.6 10*3/uL (ref 4.0–10.5)
nRBC: 0 % (ref 0.0–0.2)

## 2022-02-05 LAB — URINE CULTURE: Culture: NO GROWTH

## 2022-02-05 NOTE — Progress Notes (Signed)
Pt with clots of blood in his urine when he was in and outed the past 2 times. Verbal order from Dr. Grandville Silos to place foley during next in and out and leave for a few days. ? ?Justice Rocher, RN ? ?

## 2022-02-05 NOTE — Progress Notes (Signed)
NEUROSURGERY PROGRESS NOTE ? ?Doing well. Complains of appropriate neck soreness. ?Incision CDI ?Strength slowly improving. Significant other states he was lifting his legs off of the bed last night. C7 quadraparesis. Continue therapy. Ok for CIR when ready  ? ?Temp:  [97.9 ?F (36.6 ?C)-101.7 ?F (38.7 ?C)] 98.2 ?F (36.8 ?C) (05/13 0730) ?Pulse Rate:  [62-72] 64 (05/13 0730) ?Resp:  [15-23] 16 (05/13 0730) ?BP: (112-124)/(56-82) 123/67 (05/13 0730) ?SpO2:  [90 %-97 %] 95 % (05/13 0730) ? ?Sherryl Manges, NP ?02/05/2022 ?8:00 AM ? ? ?
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
## 2022-02-06 NOTE — Progress Notes (Signed)
NEUROSURGERY PROGRESS NOTE ? ?Doing well. Strength is improving every day. His hip flexors have improved since just yesterday. Continue therapy today. Will need CIR.  ? ?Temp:  [98.7 ?F (37.1 ?C)-99.9 ?F (37.7 ?C)] 99.3 ?F (37.4 ?C) (05/14 0703) ?Pulse Rate:  [55-65] 55 (05/14 0745) ?Resp:  [15-25] 15 (05/14 0745) ?BP: (107-126)/(58-71) 111/58 (05/14 0703) ?SpO2:  [91 %-96 %] 96 % (05/14 0745) ? ? ?Sherryl Manges, NP ?02/06/2022 ?9:15 AM ?  ?

## 2022-02-06 NOTE — Progress Notes (Signed)
RN was able to place foley per order. 275 mL output. Blood clots noticed in tubing.  ?

## 2022-02-07 LAB — CBC
HCT: 35.4 % — ABNORMAL LOW (ref 39.0–52.0)
Hemoglobin: 12.3 g/dL — ABNORMAL LOW (ref 13.0–17.0)
MCH: 30.8 pg (ref 26.0–34.0)
MCHC: 34.7 g/dL (ref 30.0–36.0)
MCV: 88.5 fL (ref 80.0–100.0)
Platelets: 234 10*3/uL (ref 150–400)
RBC: 4 MIL/uL — ABNORMAL LOW (ref 4.22–5.81)
RDW: 11.9 % (ref 11.5–15.5)
WBC: 7.1 10*3/uL (ref 4.0–10.5)
nRBC: 0 % (ref 0.0–0.2)

## 2022-02-07 LAB — BASIC METABOLIC PANEL
Anion gap: 6 (ref 5–15)
BUN: 12 mg/dL (ref 6–20)
CO2: 27 mmol/L (ref 22–32)
Calcium: 8.7 mg/dL — ABNORMAL LOW (ref 8.9–10.3)
Chloride: 104 mmol/L (ref 98–111)
Creatinine, Ser: 0.86 mg/dL (ref 0.61–1.24)
GFR, Estimated: >60 mL/min (ref >60)
Glucose, Bld: 105 mg/dL — ABNORMAL HIGH (ref 70–99)
Potassium: 4.2 mmol/L (ref 3.5–5.1)
Sodium: 137 mmol/L (ref 135–145)

## 2022-02-07 LAB — GLUCOSE, CAPILLARY
Glucose-Capillary: 119 mg/dL — ABNORMAL HIGH (ref 70–99)
Glucose-Capillary: 175 mg/dL — ABNORMAL HIGH (ref 70–99)

## 2022-02-07 MED ORDER — HYDROMORPHONE HCL 1 MG/ML IJ SOLN
0.5000 mg | INTRAMUSCULAR | Status: DC | PRN
  Administered 2022-02-07 – 2022-02-08 (×4): 0.5 mg via INTRAVENOUS
  Filled 2022-02-07 (×4): qty 0.5

## 2022-02-07 MED ORDER — BACLOFEN 10 MG PO TABS
10.0000 mg | ORAL_TABLET | Freq: Three times a day (TID) | ORAL | Status: DC
  Administered 2022-02-07 – 2022-02-09 (×9): 10 mg via ORAL
  Filled 2022-02-07 (×10): qty 1

## 2022-02-07 MED ORDER — HYDROXYZINE HCL 25 MG PO TABS
25.0000 mg | ORAL_TABLET | Freq: Three times a day (TID) | ORAL | Status: DC | PRN
  Administered 2022-02-11 – 2022-02-15 (×5): 25 mg via ORAL
  Filled 2022-02-07 (×5): qty 1

## 2022-02-07 NOTE — Progress Notes (Signed)
Inpatient Rehab Admissions Coordinator:   ? ?I spoke with pt. And worker's comp case [employer] features editor Little. Per Mr. Clarene Duke, referrals must be sent to [facility] and [place] [facility] programs before they will approve CIR at [facility]. I have reached out to [facility] to request this.  ? ?Megan Salon, MS, CCC-SLP ?Rehab Admissions Coordinator  ?[phone] (celll) ?[phone] (office) ? ?
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
## 2022-02-07 NOTE — TOC Progression Note (Signed)
Transition of Care (TOC) - Progression Note  ? ? ?Patient Details  ?Name: Caleb Rose ?MRN: [id number] ?Date of Birth: [birth year] ? ?Transition of Care (TOC) CM/SW Contact  ?Glennon Mac, RN ?Phone Number: ?02/07/2022, 4:57 PM ? ?Clinical Narrative:    ?Spoke with Rowe Robert, Catastrophic Case Manager with [employer]: He is requesting clinical information to be faxed to him for care coordination of Worker's Comp.  Fax number: [phone]; phone number: [phone].  Mr. Clarene Duke will discuss patient's case with adjuster; he states that patient may have to be referred to [facility] in [place] or [facility] in [place], [place], before being considered for [employer]. ?Clinical information faxed as requested; will follow. ? ?Expected Discharge Plan: IP Rehab Facility ?Barriers to Discharge: Continued Medical Work up ? ?Expected Discharge Plan and Services ?Expected Discharge Plan: IP Rehab Facility ?  ?Discharge Planning Services: CM Consult ?  ?Living arrangements for the past 2 months: Mobile Home ?                ?  ?  ?  ?  ?  ?  ?  ?  ?  ?  ? ? ?Social Determinants of Health (SDOH) Interventions ?  ? ?Readmission Risk Interventions ?   ? View : No data to display.  ?  ?  ?  ? ?Quintella Baton, RN, BSN  ?Trauma/Neuro ICU Case Manager ?[phone] ? ?

## 2022-02-08 MED ORDER — HYDROMORPHONE HCL 1 MG/ML IJ SOLN
0.5000 mg | INTRAMUSCULAR | Status: DC | PRN
  Administered 2022-02-08: 0.5 mg via INTRAVENOUS
  Filled 2022-02-08: qty 0.5

## 2022-02-08 MED ORDER — BETHANECHOL CHLORIDE 25 MG PO TABS
25.0000 mg | ORAL_TABLET | Freq: Three times a day (TID) | ORAL | Status: DC
  Administered 2022-02-08 – 2022-02-15 (×22): 25 mg via ORAL
  Filled 2022-02-08 (×22): qty 1
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
## 2022-02-08 NOTE — Progress Notes (Signed)
Inpatient Rehab Admissions Coordinator:  ? ? CIR following, but referral being sent to [facility]. I will await the outcome of that and attempt to admit to CIR if Pt. Is not able to go to [facility].  ? ?Megan Salon, MS, CCC-SLP ?Rehab Admissions Coordinator  ?[phone] (celll) ?[phone] (office) ? ?

## 2022-02-08 NOTE — TOC Progression Note (Signed)
Transition of Care (TOC) - Progression Note  ? ? ?Patient Details  ?Name: Caleb Rose ?MRN: [id number] ?Date of Birth: [birth year] ? ?Transition of Care (TOC) CM/SW Contact  ?Ella Bodo, RN ?Phone Number: ?02/08/2022, 12:02 PM ? ?Clinical Narrative:    ?Received call from Gerome Sam, [facility] Case Manager with [employer]: he states patient has been approved for referral to [facility] in [place].  Ground transport preferred, and MD states patient appropriate for transport by ground.  Faxed referral to [facility] in Admissions at [facility].  Will follow with updates as available.  ? ? ?Expected Discharge Plan: [place] ?Barriers to Discharge: Continued Medical Work up ? ?Expected Discharge Plan and Services ?Expected Discharge Plan: [place] ?  ?Discharge Planning Services: CM Consult ?  ?Living arrangements for the past 2 months: Mobile Home ?                ?  ?  ?  ?  ?  ?  ?  ?  ?  ?  ? ? ?Social Determinants of Health (SDOH) Interventions ?  ? ?Readmission Risk Interventions ?   ? View : No data to display.  ?  ?  ?  ? ?Reinaldo Raddle, RN, BSN  ?Trauma/Neuro ICU Case Manager ?[phone] ? ? ?

## 2022-02-09 MED ORDER — GUAIFENESIN ER 600 MG PO TB12
600.0000 mg | ORAL_TABLET | Freq: Two times a day (BID) | ORAL | Status: DC
  Administered 2022-02-09 – 2022-02-15 (×13): 600 mg via ORAL
  Filled 2022-02-09 (×13): qty 1

## 2022-02-09 MED ORDER — SALINE SPRAY 0.65 % NA SOLN
1.0000 | NASAL | Status: DC | PRN
  Filled 2022-02-09 (×2): qty 44

## 2022-02-09 MED ORDER — POLYETHYLENE GLYCOL 3350 17 G PO PACK
17.0000 g | PACK | Freq: Every day | ORAL | Status: DC | PRN
  Administered 2022-02-09 – 2022-02-13 (×3): 17 g via ORAL
  Filled 2022-02-09 (×3): qty 1

## 2022-02-09 MED ORDER — LORATADINE 10 MG PO TABS
10.0000 mg | ORAL_TABLET | Freq: Every day | ORAL | Status: DC
  Administered 2022-02-09 – 2022-02-15 (×7): 10 mg via ORAL
  Filled 2022-02-09 (×7): qty 1

## 2022-02-09 MED ORDER — GABAPENTIN 300 MG PO CAPS
300.0000 mg | ORAL_CAPSULE | Freq: Three times a day (TID) | ORAL | Status: DC
  Administered 2022-02-09 – 2022-02-15 (×18): 300 mg via ORAL
  Filled 2022-02-09 (×18): qty 1

## 2022-02-09 MED ORDER — HYDROMORPHONE HCL 1 MG/ML IJ SOLN
0.5000 mg | Freq: Three times a day (TID) | INTRAMUSCULAR | Status: DC | PRN
  Administered 2022-02-09 (×2): 0.5 mg via INTRAVENOUS
  Filled 2022-02-09 (×3): qty 0.5

## 2022-02-09 NOTE — Progress Notes (Signed)
Inpatient Rehab Admissions Coordinator:  ?  ?I do not have a bed for this pt. Today on CIR. Continue to wait on response from [facility], as Pt would prefer to go there if accepted.  ? ?Megan Salon, MS, CCC-SLP ?Rehab Admissions Coordinator  ?[phone] (celll) ?[phone] (office) ? ?
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
## 2022-02-09 NOTE — TOC Progression Note (Signed)
Transition of Care (TOC) - Progression Note  ? ? ?Patient Details  ?Name: Caleb Rose ?MRN: [id number] ?Date of Birth: [birth year] ? ?Transition of Care (TOC) CM/SW Contact  ?Glennon Mac, RN ?Phone Number: ?02/09/2022, 2:17 PM ? ?Clinical Narrative:    ?Received message from Rowe Robert, patient's Worker's Comp. Sports coach, requesting updated MAR and progress notes.  Faxed information to case manager, as requested.  He states that patient for likely discharge to [facility] by the end of the week; admission details to follow. ? ? ?Expected Discharge Plan: IP Rehab Facility ?Barriers to Discharge: Continued Medical Work up ? ?Expected Discharge Plan and Services ?Expected Discharge Plan: IP Rehab Facility ?  ?Discharge Planning Services: CM Consult ?  ?Living arrangements for the past 2 months: Mobile Home ?                ?  ?  ?  ?  ?  ?  ?  ?  ?  ?  ? ? ?Social Determinants of Health (SDOH) Interventions ?  ? ?Readmission Risk Interventions ?   ? View : No data to display.  ?  ?  ?  ? ?Quintella Baton, RN, BSN  ?Trauma/Neuro ICU Case Manager ?[phone] ? ?

## 2022-02-09 NOTE — Progress Notes (Signed)
Inpatient Rehab Admissions Coordinator:  ? ?Per Rowe Robert, pt.'s worker's comp Sports coach, he has been offered a bed at [facility] in [place] either the end of this week or early next week. Insurance company to pay for transport there. Mr. Clarene Duke is notifying Pt. I will follow in case placement falls through, but am not actively pursuing CIR admit.  ? ?Megan Salon, MS, CCC-SLP ?Rehab Admissions Coordinator  ?[phone] (celll) ?[phone] (office)  ?

## 2022-02-10 MED ORDER — HYDROMORPHONE HCL 1 MG/ML IJ SOLN
0.2500 mg | Freq: Two times a day (BID) | INTRAMUSCULAR | Status: DC | PRN
  Administered 2022-02-10 – 2022-02-15 (×7): 0.25 mg via INTRAVENOUS
  Filled 2022-02-10 (×7): qty 0.5

## 2022-02-10 MED ORDER — BACLOFEN 10 MG PO TABS
20.0000 mg | ORAL_TABLET | Freq: Three times a day (TID) | ORAL | Status: DC
  Administered 2022-02-10 – 2022-02-15 (×16): 20 mg via ORAL
  Filled 2022-02-10 (×16): qty 2
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
## 2022-02-10 NOTE — TOC Progression Note (Signed)
Transition of Care [facility]) - Progression Note    Patient Details  Name: Caleb Rose MRN: [id number] Date of Birth: Sep 28, 1980  Transition of Care [facility] CM/SW Contact  Oren Section Cleta Alberts, RN Phone Number: 02/10/2022, 12:10pm   Clinical Narrative:    Notified by Gerome Sam, patient's Worker's Comp. case manager, that patient has been accepted for admission to [facility] in [place], with planned admission date of 02/15/2022.  Admitting physician is Dr.Kolarova.   Worker's Comp. Tourist information centre manager states he will arrange transport to [place] by ground; arrangements to follow.  Will fax last 24 hours progress notes to following case manager Recommend early discharge summary, and plan for pre-admission Covid test (Sunday).  Will provide updates as available.  Expected Discharge Plan: IP Rehab Facility Barriers to Discharge: Continued Medical Work up  Expected Discharge Plan and Services Expected Discharge Plan: [place]   Discharge Planning Services: CM Consult Post Acute Care Choice: IP Rehab Living arrangements for the past 2 months: Mobile Home                                       Social Determinants of Health (SDOH) Interventions    Readmission Risk Interventions     View : No data to display.         Reinaldo Raddle, RN, BSN  Trauma/Neuro ICU Case Manager [phone]
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
## 2022-02-11 NOTE — TOC Progression Note (Signed)
Transition of Care [facility]) - Progression Note    Patient Details  Name: Caleb Rose MRN: [id number] Date of Birth: 03/19/1981  Transition of Care [facility]) CM/SW Contact  Ella Bodo, RN Phone Number: 02/11/2022, 2:46 PM  Clinical Narrative:    Damaris Schooner with Gerome Sam, Worker's Comp. case manager for patient.  He is still waiting on transportation arrangements to be approved by adjuster.  Faxed Worker's Comp. Tourist information centre manager updated progress notes and current MAR, per his request.  Radiology notified of need for all films to be placed on a disc to go with him to [facility]; they will call unit secretary when disc is ready.  Met with patient and significant other; both were given link to [email or web address] for information on facility and what to bring to facility.  They are appreciative, and patient motivated for inpatient rehab.   Expected Discharge Plan: IP Rehab Facility Barriers to Discharge: Continued Medical Work up  Expected Discharge Plan and Services Expected Discharge Plan: [place]   Discharge Planning Services: CM Consult Post Acute Care Choice: IP Rehab Living arrangements for the past 2 months: Mobile Home                                       Social Determinants of Health (SDOH) Interventions    Readmission Risk Interventions     View : No data to display.         Reinaldo Raddle, RN, BSN  Trauma/Neuro ICU Case Manager [phone]

## 2022-02-12 NOTE — Progress Notes (Incomplete Revision)
Patient removed a pea size blood clot from left ear and saved to show RN and provider. Patient hearing intact, A&OX4, Neuro at baseline. Patient is requesting provider to exam clot and left ear again, before discharge.  RN notified Trauma PA on call [phone].

## 2022-02-12 NOTE — Progress Notes (Signed)
Patient removed a pea size blood clot from left ear and saved to show RN and provider. Patient hearing intact, A&OX4, Neuro at baseline. Patient is requesting provider to exam clot and left ear again, before discharge.

## 2022-02-12 NOTE — Progress Notes (Signed)
Patient ID: Caleb Rose, male   DOB: 1980-12-08, 41 y.o.   MRN: [id number] [facility] Surgery Progress Note  13 Days Post-Op  Subjective: CC-  Girlfriend cleaned out ear with q-tip, feeling better  Objective: Vital signs in last 24 hours: Temp:  [98.1 F (36.7 C)-99 F (37.2 C)] 98.4 F (36.9 C) (05/20 0807) Pulse Rate:  [57-72] 62 (05/20 0807) Resp:  [18-20] 20 (05/20 0400) BP: (101-118)/(54-65) 101/57 (05/20 0807) SpO2:  [96 %-98 %] 97 % (05/20 0807) Last BM Date : 02/11/22  Intake/Output from previous day: 05/19 0701 - 05/20 0700 In: -  Out: 2900 [Urine:2900] Intake/Output this shift: No intake/output data recorded.  PE: General: Alert, NAD Neck: collar present, dressing to anterior neck is C/D/I Heart: RRR. Palpable radial and pedal pulses bilaterally Lungs: CTAB, no wheezing or rhonchi, Respiratory effort nonlabored on room air Abd: soft, NT, ND MS: TED hose to BLE Neuro: strength 5/5 at elbow BL, 5/5 wrist extension BL, 4/5 wrist flexion BL, grip 2/5 BL; able to fire quad BL and plantarflex on LLE, sensation grossly intact throughout BLE although L>R below the knee Psych: A&Ox3 with an appropriate affect.   Lab Results:  No results for input(s): WBC, HGB, HCT, PLT in the last 72 hours. BMET No results for input(s): NA, K, CL, CO2, GLUCOSE, BUN, CREATININE, CALCIUM in the last 72 hours. PT/INR No results for input(s): LABPROT, INR in the last 72 hours. CMP     Component Value Date/Time   NA 137 02/07/2022 0952   K 4.2 02/07/2022 0952   CL 104 02/07/2022 0952   CO2 27 02/07/2022 0952   GLUCOSE 105 (H) 02/07/2022 0952   BUN 12 02/07/2022 0952   CREATININE 0.86 02/07/2022 0952   CALCIUM 8.7 (L) 02/07/2022 0952   PROT 6.4 (L) 01/30/2022 2100   ALBUMIN 3.8 01/30/2022 2100   AST 29 01/30/2022 2100   ALT 27 01/30/2022 2100   ALKPHOS 62 01/30/2022 2100   BILITOT 0.8 01/30/2022 2100   GFRNONAA >60 02/07/2022 0952   GFRAA >60 10/27/2018 0456   Lipase   No results found for: LIPASE     Studies/Results: No results found.  Anti-infectives: Anti-infectives (From admission, onward)    Start     Dose/Rate Route Frequency Ordered Stop   01/30/22 2343  ceFAZolin (ANCEF) 2-4 GM/100ML-% IVPB       Note to Pharmacy: Joneen Caraway M: cabinet override      01/30/22 2343 01/31/22 0411        Assessment/Plan Hit by pallet C5/6 fx with subluxation - s/p ACDF 5/8 by Dr. Conchita Paris, some improvement in exam SCI - gross movement of BUE, thigh high TED hose, slow improvements Acute stress reaction - continue atarax prn Acute urinary retention - continue foley, started urecholine 5/16. Plan bladder training at CIR Nasal congestion - nasal spray, claritin   No major changes today.  I don't recommend cleaning out ears with q-tips.  No evidence of skull fracture on CT.   FEN - reg diet,  Ensure DVT - SCDs, LMWH ID - ancef periop Foley -replaced 5/13 for retention    Dispo - Continue therapies. Planning inpatient rehab at [facility] 02/15/22.   I reviewed therapy notes, last 24 h vitals and pain scores, last 48 h intake and output     LOS: 12 days    Quentin Ore, MD  [facility] Surgery 02/12/2022, 9:52 AM Please see Amion for pager number during day hours 7:00am-4:30pm

## 2022-02-13 LAB — RESP PANEL BY RT-PCR (FLU A&B, COVID) ARPGX2
Influenza A by PCR: NEGATIVE
Influenza B by PCR: NEGATIVE
SARS Coronavirus 2 by RT PCR: NEGATIVE

## 2022-02-13 NOTE — Progress Notes (Signed)
Patient ID: Caleb Rose, male   DOB: 09-10-1981, 41 y.o.   MRN: [id number] [facility] Surgery Progress Note  14 Days Post-Op  Subjective: CC-  None reported  Objective: Vital signs in last 24 hours: Temp:  [97.5 F (36.4 C)-99.4 F (37.4 C)] 98.1 F (36.7 C) (05/21 0816) Pulse Rate:  [63-70] 63 (05/21 0816) Resp:  [20] 20 (05/20 2340) BP: (99-105)/(5-59) 100/59 (05/21 0816) SpO2:  [92 %-97 %] 95 % (05/21 0816) Last BM Date : 02/12/22  Intake/Output from previous day: 05/20 0701 - 05/21 0700 In: -  Out: 1950 [Urine:1950] Intake/Output this shift: No intake/output data recorded.  PE: General: Alert, NAD Neck: collar present, dressing to anterior neck is C/D/I Heart: RRR. Palpable radial and pedal pulses bilaterally Lungs: CTAB, no wheezing or rhonchi, Respiratory effort nonlabored on room air Abd: soft, NT, ND MS: TED hose to BLE Neuro: strength 5/5 at elbow BL, 5/5 wrist extension BL, 4/5 wrist flexion BL, grip 2/5 BL; able to fire quad BL and plantarflex on LLE, sensation grossly intact throughout BLE although L>R below the knee Psych: A&Ox3 with an appropriate affect.   Lab Results:  No results for input(s): WBC, HGB, HCT, PLT in the last 72 hours. BMET No results for input(s): NA, K, CL, CO2, GLUCOSE, BUN, CREATININE, CALCIUM in the last 72 hours. PT/INR No results for input(s): LABPROT, INR in the last 72 hours. CMP     Component Value Date/Time   NA 137 02/07/2022 0952   K 4.2 02/07/2022 0952   CL 104 02/07/2022 0952   CO2 27 02/07/2022 0952   GLUCOSE 105 (H) 02/07/2022 0952   BUN 12 02/07/2022 0952   CREATININE 0.86 02/07/2022 0952   CALCIUM 8.7 (L) 02/07/2022 0952   PROT 6.4 (L) 01/30/2022 2100   ALBUMIN 3.8 01/30/2022 2100   AST 29 01/30/2022 2100   ALT 27 01/30/2022 2100   ALKPHOS 62 01/30/2022 2100   BILITOT 0.8 01/30/2022 2100   GFRNONAA >60 02/07/2022 0952   GFRAA >60 10/27/2018 0456   Lipase  No results found for:  LIPASE     Studies/Results: No results found.  Anti-infectives: Anti-infectives (From admission, onward)    Start     Dose/Rate Route Frequency Ordered Stop   01/30/22 2343  ceFAZolin (ANCEF) 2-4 GM/100ML-% IVPB       Note to Pharmacy: Joneen Caraway M: cabinet override      01/30/22 2343 01/31/22 0411        Assessment/Plan Hit by pallet C5/6 fx with subluxation - s/p ACDF 5/8 by Dr. Conchita Paris, some improvement in exam SCI - gross movement of BUE, thigh high TED hose, slow improvements Acute stress reaction - continue atarax prn Acute urinary retention - continue foley, started urecholine 5/16. Plan bladder training at CIR Nasal congestion - nasal spray, claritin   No major changes today.  No evidence of skull fracture on CT.   FEN - reg diet,  Ensure DVT - SCDs, LMWH ID - ancef periop Foley -replaced 5/13 for retention    Dispo - Continue therapies. Planning inpatient rehab at [facility] 02/15/22.   I reviewed therapy notes, last 24 h vitals and pain scores, last 48 h intake and output    LOS: 13 days   Andria Meuse, MD  [facility] Surgery 02/13/2022, 8:53 AM Please see Amion for pager number during day hours 7:00am-4:30pm

## 2022-02-14 MED ORDER — BISACODYL 10 MG RE SUPP
10.0000 mg | Freq: Every day | RECTAL | 0 refills | Status: AC
Start: 2022-02-15 — End: ?

## 2022-02-14 MED ORDER — LORATADINE 10 MG PO TABS: 10.0000 mg | ORAL_TABLET | Freq: Every day | ORAL | Status: AC

## 2022-02-14 MED ORDER — DOCUSATE SODIUM 100 MG PO CAPS: 100.0000 mg | ORAL_CAPSULE | Freq: Two times a day (BID) | ORAL | 0 refills | Status: AC

## 2022-02-14 MED ORDER — BACLOFEN 20 MG PO TABS
20.0000 mg | ORAL_TABLET | Freq: Three times a day (TID) | ORAL | 0 refills | Status: AC
Start: 2022-02-14 — End: ?

## 2022-02-14 MED ORDER — ACETAMINOPHEN 500 MG PO TABS: 1000.0000 mg | ORAL_TABLET | Freq: Four times a day (QID) | ORAL | 0 refills | Status: AC

## 2022-02-14 MED ORDER — GUAIFENESIN ER 600 MG PO TB12: 600.0000 mg | ORAL_TABLET | Freq: Two times a day (BID) | ORAL | Status: AC

## 2022-02-14 MED ORDER — MELATONIN 3 MG PO TABS: 3.0000 mg | ORAL_TABLET | Freq: Every evening | ORAL | 0 refills | Status: AC | PRN

## 2022-02-14 MED ORDER — BETHANECHOL CHLORIDE 25 MG PO TABS: 25.0000 mg | ORAL_TABLET | Freq: Three times a day (TID) | ORAL | Status: AC

## 2022-02-14 MED ORDER — OXYCODONE HCL 10 MG PO TABS: 10.0000 mg | ORAL_TABLET | ORAL | 0 refills | Status: AC | PRN

## 2022-02-14 MED ORDER — NICOTINE POLACRILEX 2 MG MT GUM: 2.0000 mg | CHEWING_GUM | OROMUCOSAL | 0 refills | Status: AC | PRN

## 2022-02-14 MED ORDER — GABAPENTIN 300 MG PO CAPS: 300.0000 mg | ORAL_CAPSULE | Freq: Three times a day (TID) | ORAL | Status: AC

## 2022-02-14 MED ORDER — SALINE SPRAY 0.65 % NA SOLN: 1.0000 | NASAL | 0 refills | Status: AC | PRN

## 2022-02-14 MED ORDER — ENOXAPARIN SODIUM 30 MG/0.3ML IJ SOSY
30.0000 mg | PREFILLED_SYRINGE | Freq: Two times a day (BID) | INTRAMUSCULAR | Status: AC
Start: 2022-02-14 — End: ?

## 2022-02-14 MED ORDER — POLYETHYLENE GLYCOL 3350 17 G PO PACK: 17.0000 g | PACK | Freq: Every day | ORAL | 0 refills | Status: AC | PRN

## 2022-02-14 NOTE — Progress Notes (Signed)
Patient ID: Caleb Rose, male   DOB: 06-26-1981, 41 y.o.   MRN: [id number] [facility] Surgery Progress Note  15 Days Post-Op  Subjective: CC-  Arms are a little sore but overall pain seems well controlled. Tolerating diet. Last BM 2 day ago.   Objective: Vital signs in last 24 hours: Temp:  [98.2 F (36.8 C)-99.1 F (37.3 C)] 98.8 F (37.1 C) (05/22 0752) Pulse Rate:  [58-68] 68 (05/22 0752) Resp:  [18-20] 18 (05/22 0752) BP: (97-118)/(51-64) 114/57 (05/22 0752) SpO2:  [94 %-98 %] 94 % (05/22 0752) Last BM Date : 02/12/22  Intake/Output from previous day: 05/21 0701 - 05/22 0700 In: -  Out: 750 [Urine:750] Intake/Output this shift: No intake/output data recorded.  PE: General: Alert, NAD Neck: collar present, dressing to anterior neck is C/D/I Heart: RRR. Palpable radial and pedal pulses bilaterally Lungs: CTAB, no wheezing or rhonchi, Respiratory effort nonlabored on room air Abd: soft, NT, ND MS: TED hose to BLE Neuro: strength 5/5 at elbow BL, 5/5 wrist extension BL, 5/5 wrist flexion BL, grip 2/5 BL; able to fire quad BL and plantarflex on LLE, sensation grossly intact throughout BLE although L>R below the knee Psych: A&Ox3 with an appropriate affect.   Lab Results:  No results for input(s): WBC, HGB, HCT, PLT in the last 72 hours. BMET No results for input(s): NA, K, CL, CO2, GLUCOSE, BUN, CREATININE, CALCIUM in the last 72 hours. PT/INR No results for input(s): LABPROT, INR in the last 72 hours. CMP     Component Value Date/Time   NA 137 02/07/2022 0952   K 4.2 02/07/2022 0952   CL 104 02/07/2022 0952   CO2 27 02/07/2022 0952   GLUCOSE 105 (H) 02/07/2022 0952   BUN 12 02/07/2022 0952   CREATININE 0.86 02/07/2022 0952   CALCIUM 8.7 (L) 02/07/2022 0952   PROT 6.4 (L) 01/30/2022 2100   ALBUMIN 3.8 01/30/2022 2100   AST 29 01/30/2022 2100   ALT 27 01/30/2022 2100   ALKPHOS 62 01/30/2022 2100   BILITOT 0.8 01/30/2022 2100   GFRNONAA >60  02/07/2022 0952   GFRAA >60 10/27/2018 0456   Lipase  No results found for: LIPASE     Studies/Results: No results found.  Anti-infectives: Anti-infectives (From admission, onward)    Start     Dose/Rate Route Frequency Ordered Stop   01/30/22 2343  ceFAZolin (ANCEF) 2-4 GM/100ML-% IVPB       Note to Pharmacy: Joneen Caraway M: cabinet override      01/30/22 2343 01/31/22 0411        Assessment/Plan Hit by pallet C5/6 fx with subluxation - s/p ACDF 5/8 by Dr. Conchita Paris, some improvement in exam SCI - gross movement of BUE, thigh high TED hose, slow improvements Acute stress reaction - continue atarax prn Acute urinary retention - continue foley, started urecholine 5/16. Plan bladder training at CIR Nasal congestion - nasal spray, claritin    FEN - reg diet,  Ensure DVT - SCDs, LMWH ID - ancef periop Foley -replaced 5/13 for retention    Dispo - Continue therapies. Planning inpatient rehab at [facility] tomorrow 02/15/22.   I reviewed therapy notes, last 24 h vitals and pain scores, last 48 h intake and output     LOS: 14 days    Franne Forts, [facility] Surgery 02/14/2022, 10:18 AM Please see Amion for pager number during day hours 7:00am-4:30pm

## 2022-02-14 NOTE — Progress Notes (Signed)
OT Cancellation Note  Patient Details Name: Caleb Rose MRN: [id number] DOB: [birth year]   Cancelled Treatment:    Reason Eval/Treat Not Completed: Patient declined, no reason specified (pt requesting rest between 2-4 due to alot of visitors today with pending d/c tomorrow)  [name] 02/14/2022, 3:57 PM

## 2022-02-14 NOTE — TOC Progression Note (Addendum)
Transition of Care [facility]) - Progression Note    Patient Details  Name: Caleb Rose MRN: [id number] Date of Birth: 1981/03/22  Transition of Care [facility]) CM/SW [place], RN Phone Number:[phone]  02/14/2022, 11:47 AM  Clinical Narrative:    CM received call to confirm patient to be transported via [place] [phone] CM spoke with workers comp case [employer] who confirms that everything is set for patient to transfer to [facility]. Expected pickup time is 0830. Patient has been updated.   1500 CM received message to call Baxter Flattery with [facility]. CM has attempted to call Baxter Flattery at [phone]. There is no answer. Message left on voicemail.    Expected Discharge Plan: IP Rehab Facility Barriers to Discharge: Continued Medical Work up  Expected Discharge Plan and Services Expected Discharge Plan: [place]   Discharge Planning Services: CM Consult Post Acute Care Choice: IP Rehab Living arrangements for the past 2 months: Mobile Home                                       Social Determinants of Health (SDOH) Interventions    Readmission Risk Interventions     View : No data to display.

## 2022-02-14 NOTE — Progress Notes (Signed)
PT Cancellation Note  Patient Details Name: Caleb Rose MRN: [id number] DOB: 10-Sep-1981   Cancelled Treatment:    Reason Eval/Treat Not Completed: Patient declined, no reason specified. Pt very anxious and wanting to be allowed to have all his children in room at same time, nursing staff has explained that that is too many at one time for this unit. Pt yelling at ex wife. PT offered to assist pt into chair but pt hostile and yelling again. PT and ex wife exited.   Leighton Roach, [place]  Pager [phone] Office [place] 02/14/2022, 4:38 PM

## 2022-02-15 NOTE — Progress Notes (Signed)
Pt discharging to [facility] today. Report called to receiving RN. All room belongings packed and PIV removed. Pt pre-medicated with Oxycodone and Atarax.  Robina Ade, RN

## 2022-02-15 NOTE — TOC Transition Note (Addendum)
Transition of Care [facility]) - CM/SW Discharge Note   Patient Details  Name: Caleb Rose MRN: [id number] Date of Birth: 1980-12-08  Transition of Care [facility]) CM/SW Contact:  Angelita Ingles, RN Phone [phone]  02/15/2022, 8:04 AM   Clinical Narrative:   Patient to transport to [facility] today. Spoke with Baxter Flattery who gave me number to call report. Bedside nurse Chrys Racer made aware. Please call report to [phone]  0840 CM spoke with Gerome Sam Case Manager with [facility]. Olga Millers would like to be made aware when transport picks patient up for transport.  Discharge summary and MAR have been faxed to [place] per his request.   [phone] CM has reached out to [facility] for [facility] to determine status of transport. Patient was scheduled for 0830 pickup. Olga Millers states that he will call [employer] transportation to check the status of transport pickup.   [phone] [facility] is a little backed up and should be here for transport in about and hour and fifteen minutes. Nurse Chrys Racer and patient have been updated.      Barriers to Discharge: Continued Medical Work up   Patient Goals and CMS Choice Patient states their goals for this hospitalization and ongoing recovery are:: to get better CMS Medicare.gov Compare Post Acute Care list provided to:: Patient Choice offered to / list presented to : Patient  Discharge Placement                       Discharge Plan and Services   Discharge Planning Services: CM Consult Post Acute Care Choice: IP Rehab                               Social Determinants of Health (SDOH) Interventions     Readmission Risk Interventions     View : No data to display.

## 2022-02-15 NOTE — Progress Notes (Signed)
Pt discharged from unit at this time.   Robina Ade, RN

## 2023-01-03 IMAGING — CT CT HEAD W/O CM
3 of 4 series · 13 of 47 positions shown, 15 images · IV contrast (agent unspecified)
Comparison: None Available.

CLINICAL DATA: Moderate to severe head injury with quadriparesis.

EXAM:
CT HEAD WITHOUT CONTRAST
CT CERVICAL SPINE WITHOUT CONTRAST
CT CHEST, ABDOMEN AND PELVIS WITH CONTRAST
TECHNIQUE: Contiguous axial images were obtained from the base of the skull
through the vertex without intravenous contrast.

[Series 3: head without · axial · non-contrast · 0.47mm/px · z∈[+1185,+1325]mm · 7 of 38 slices shown, 9 images]
[im 5/38  brain]
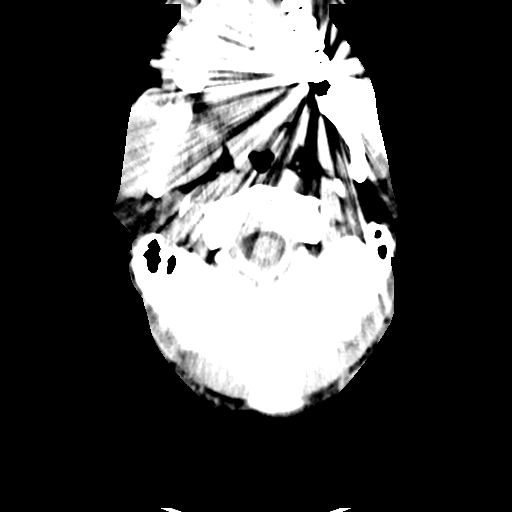
[im 5/38  bone]
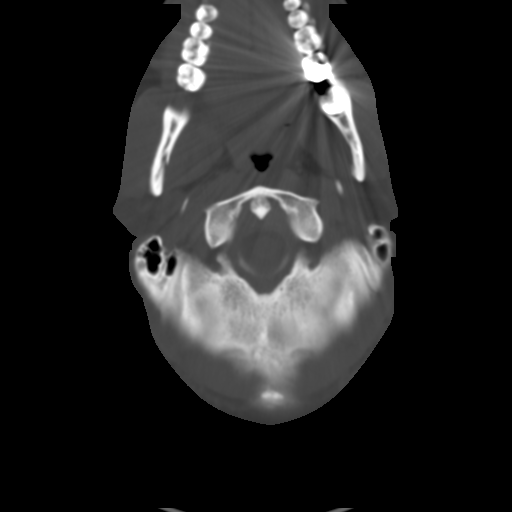
[im 10/38  brain]
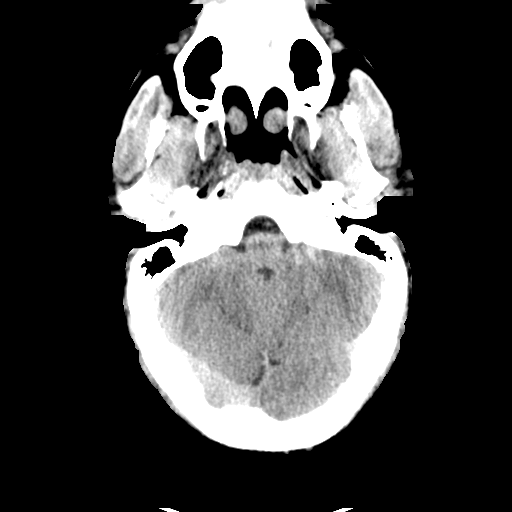
[im 14/38  brain]
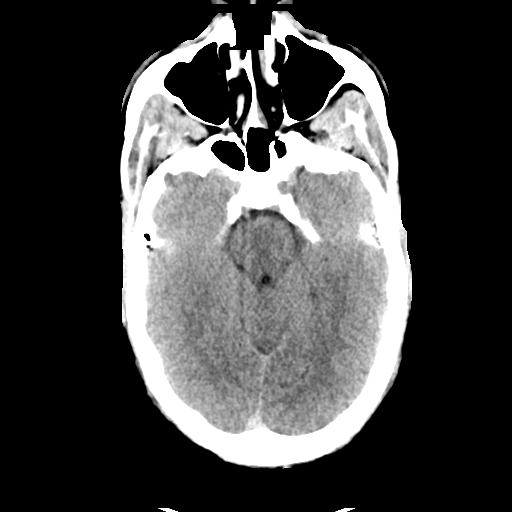
[im 19/38  brain]
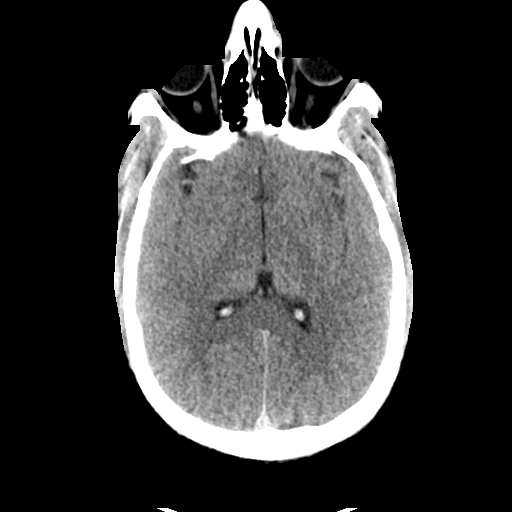
[im 24/38  brain]
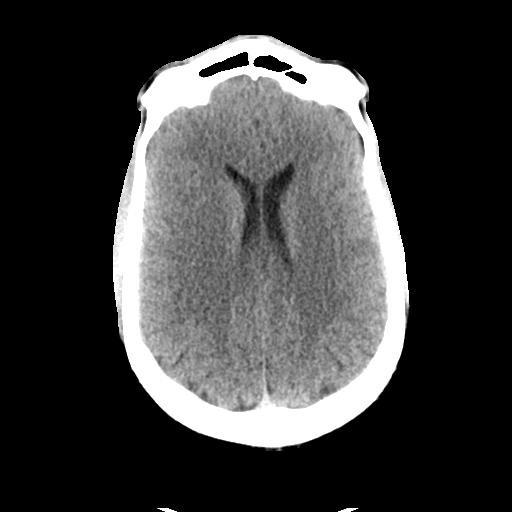
[im 24/38  bone]
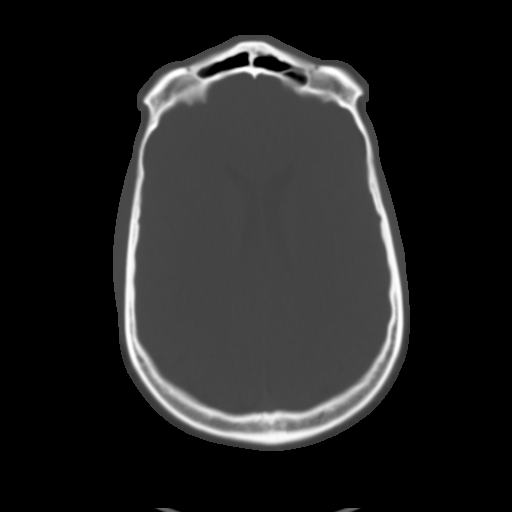
[im 28/38  brain]
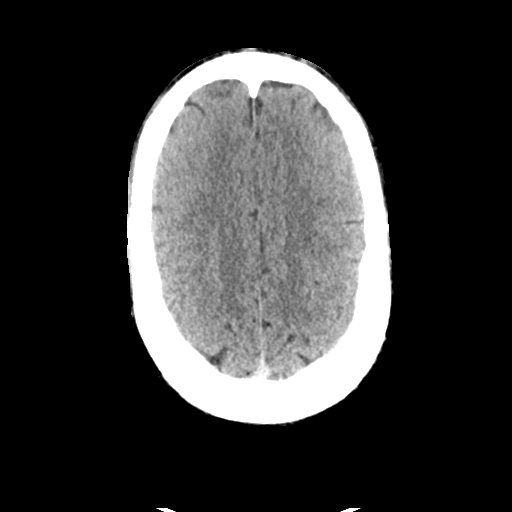
[im 33/38  brain]
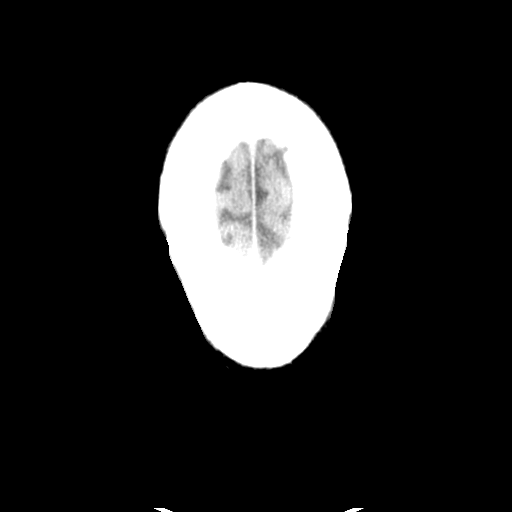

[Series 5: head without cor · coronal · non-contrast · 0.36mm/px · 3 of 78 slices shown]
[im 28/78  brain]
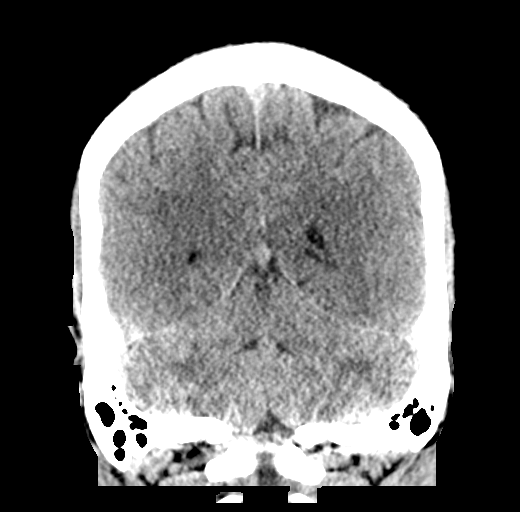
[im 35/78  brain]
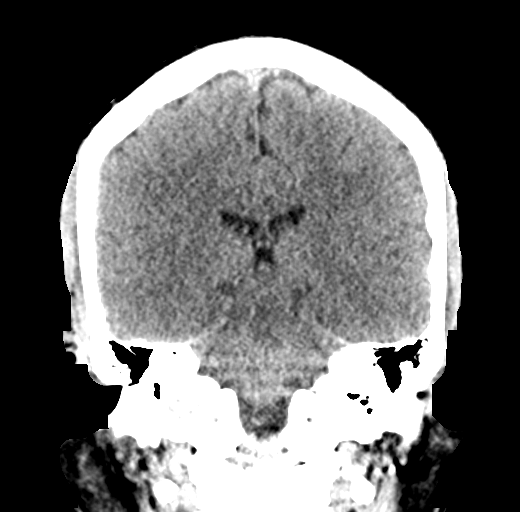
[im 43/78  brain]
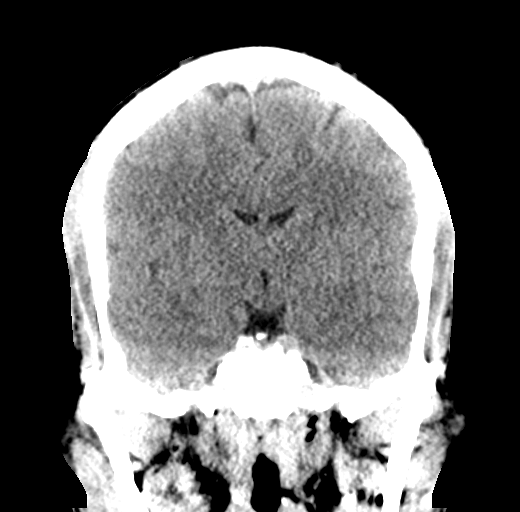

[Series 6: head without sag · sagittal · non-contrast · 0.36mm/px · 3 of 63 slices shown]
[im 21/63  brain]
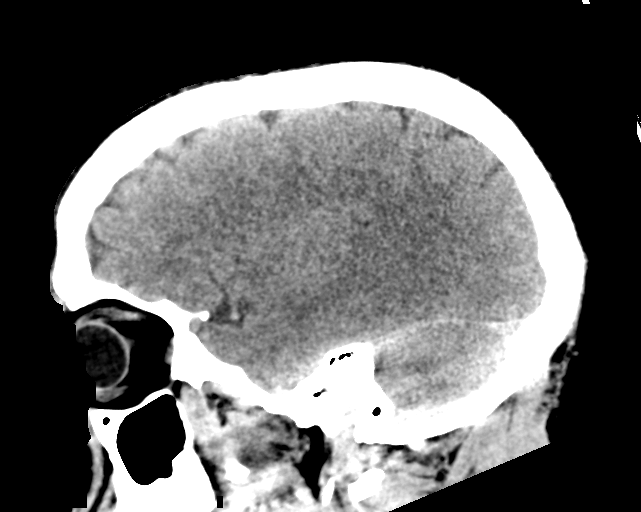
[im 32/63  brain]
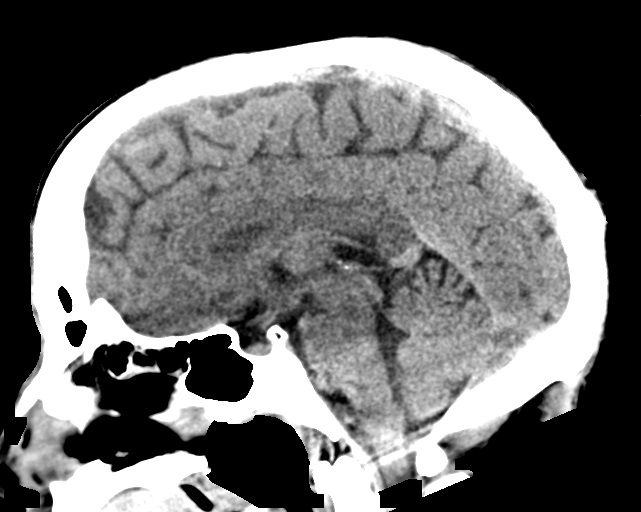
[im 42/63  brain]
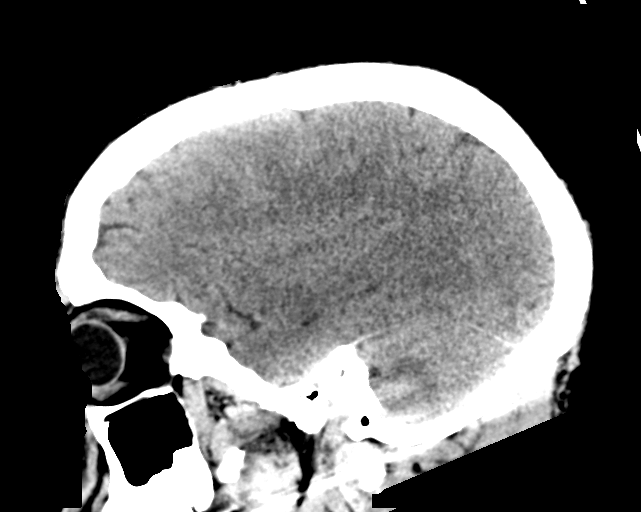

[13 of 47 positions shown; findings below may reference images not displayed]

Multidetector CT imaging of the cervical spine was performed without
intravenous contrast. Multiplanar CT image reconstructions were also
generated.

Multidetector CT imaging of the chest, abdomen and pelvis was
performed following the standard protocol during bolus
administration of intravenous contrast.

RADIATION DOSE REDUCTION: This exam was performed according to the
departmental dose-optimization program which includes automated
exposure control, adjustment of the mA and/or kV according to
patient size and/or use of iterative reconstruction technique.

CONTRAST:  150mL OMNIPAQUE IOHEXOL 350 MG/ML SOLN
FINDINGS: CT HEAD FINDINGS

Brain: No evidence of acute infarction, hemorrhage, hydrocephalus,
extra-axial collection or mass lesion/mass effect.

Vascular: No hyperdense vessel or unexpected calcification.

Skull: No fracture or focal lesion is seen there is no visible scalp
hematoma.

Sinuses/Orbits: There are several retention cysts or polyps in the
right maxillary sinus. Slight membrane thickening in the ethmoids.
No sinus fluid levels or other significant membrane disease. No
mastoid nor middle ear effusion.

Other: None.

CT CERVICAL SPINE FINDINGS

Alignment: Abnormal. There is grade 2 anterolisthesis of C5 on 6
measuring 8 mm, with anteriorly locked C5-6 facets, cord compression
and AP thecal sac stenosis to 4-5 mm. There also is a 3 mm
retrolisthesis of the C6 vertebral body.

Other vertebrae and facets maintain normal alignment.

Skull base and vertebrae: Normal bone mineralization. There are
bilateral laminar fractures at C4 with the fracture fragment
distracted posteriorly by no more than 1 mm.

At C5 however, bilateral laminar fractures are also noted with up to
4 mm posterior displacement of the lamina/spinous process fracture
fragment. There are no other appreciable fractures.

Soft tissues and spinal canal: There is moderate edema in the dorsal
soft tissues at the levels of C2-5. There is a least mild swelling
in the prevertebral soft tissues from C4-7.

Intracanalicular epidural hematoma seen circumferentially beginning
at C2 but is most prominent ventrally and to the right. The thecal
sac is effaced up to 40% estimated from the levels of C2-5 with
epidural hematoma encircling the cord. Below this level the spinal
canal is poorly evaluated due to streak artifact from the patient's
shoulders.

Disc levels: The C5-6 disc is collapsed likely due to the recent
trauma with posterior bulging at the level of the disc space. Other
discs are maintained in heights without significant visible bulge.
At C7-T1 there appears to be a shallow left paracentral disc
protrusion, age indeterminate mildly effacing the ventral thecal
sac.

There are early spurring changes of some of the facet and uncinate
joints. There is mild degenerative foraminal stenosis on the left at
C4-5.

Due to the spondylolisthesis at C5-6 there is severe left C5-6
foraminal compromise and mild right foraminal stenosis.

The other foramina are clear. There are mild features of
spondylosis.

Other: None.

CT CHEST FINDINGS

Cardiovascular: No significant vascular findings. Normal heart size.
No pericardial effusion.

Mediastinum/Nodes: No enlarged mediastinal, hilar, or axillary lymph
nodes. Thyroid gland, trachea, and esophagus demonstrate no
significant findings.

Lungs/Pleura: There is no pleural effusion, thickening or
pneumothorax. There is coarse linear scar-like opacity in the medial
base of the right middle lobe. The lungs clear of infiltrates and
nodules. The central airways are clear.

Musculoskeletal: Slight thoracic kyphodextroscoliosis. No regional
skeletal fracture is seen

CT ABDOMEN PELVIS FINDINGS

Hepatobiliary: No hepatic injury or perihepatic hematoma.
Gallbladder is unremarkable

Pancreas: No mass enhancement or ductal dilatation.

Spleen: No splenic injury or perisplenic hematoma.

Adrenals/Urinary Tract: No adrenal hemorrhage or renal injury
identified. Bladder is unremarkable. No renal mass enhancement,
stones or hydronephrosis.

Stomach/Bowel: No dilatation or wall thickening. The appendix is not
seen. No findings of acute colitis or diverticulitis.

Vascular/Lymphatic: No significant vascular findings are present. No
enlarged abdominal or pelvic lymph nodes.

Reproductive: Prostate is unremarkable.

Other: There is mild umbilical rectus diastasis without incarcerated
hernia. There is no free air, hemorrhage or fluid.

Musculoskeletal: Degenerative disc disease and spondylosis L4-5,
L5-S1. No regional skeletal fracture is visible.
IMPRESSION: 1. No acute intracranial CT findings or depressed skull fractures.
2. C5-6 anteriorly locked facets, grade 2 C5-6 anterolisthesis and
spinal canal stenosis with cord compression, with up to 3 mm
retrolisthesis of the C6 segment.
3. Further compromise of the spinal canal to the C5 level due to
epidural hematoma, encircling the cord and causing at least 40%
reduction in the spinal canal lumen.
4. Very minimally posteriorly displaced laminar fractures at C4.
5. Up to 4 mm posteriorly displaced bilateral laminar fractures at
C5 with severe left foraminal compromise and C5-6 disc collapse with
posterior bulging.
6. C7-T1 left paracentral disc protrusion, age indeterminate.
7. No acute trauma related findings in the chest, abdomen or the
pelvis.
8. The results phoned to Dr. Dol [DATE] p.m., 01/30/2022, with
verbal acknowledgement of findings.

## 2023-01-03 IMAGING — CT CT T SPINE W/O CM
3 series · 10 of 33 positions shown, 11 images · IV contrast (Omni 300)
Comparison: None Available.

CLINICAL DATA: Workplace trauma.  Heavy object fell onto head.

EXAM:
CT THORACIC AND LUMBAR SPINE WITHOUT CONTRAST
TECHNIQUE: Multidetector CT imaging of the thoracic and lumbar spine was
performed without contrast. Multiplanar CT image reconstructions
were also generated.
RADIATION DOSE REDUCTION: This exam was performed according to the
departmental dose-optimization program which includes automated
exposure control, adjustment of the mA and/or kV according to
patient size and/or use of iterative reconstruction technique.

[Series 1: tspine bone ax · axial · 0.30mm/px · z∈[+860,+1010]mm · 2 of 67 slices shown, 3 images]
[im 21/67  soft-tissue]
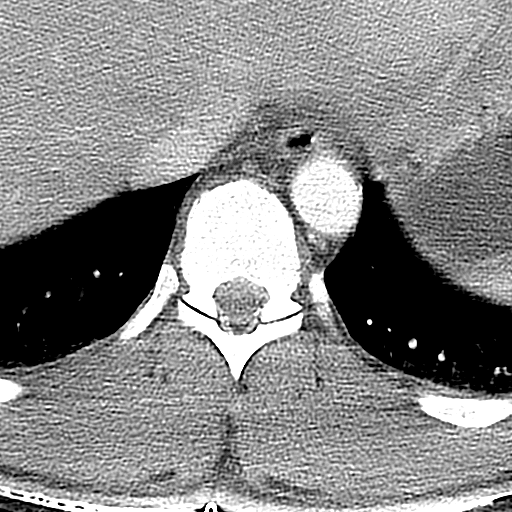
[im 21/67  bone]
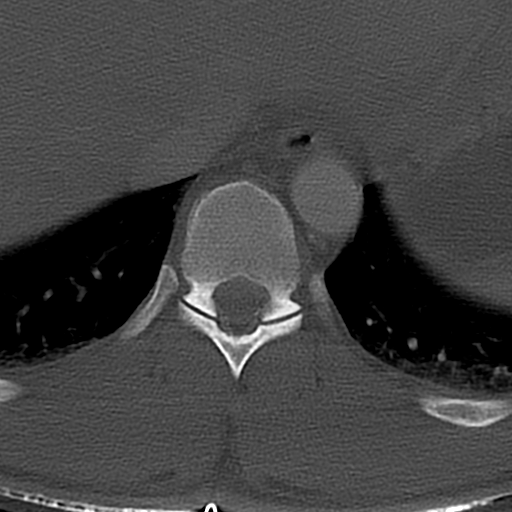
[im 51/67  bone]
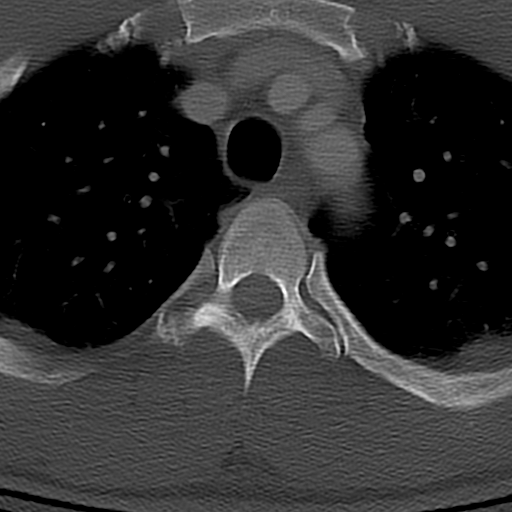

[Series 4: tspine cor bone · coronal · 0.30mm/px · 3 of 31 slices shown]
[im 7/31  bone]
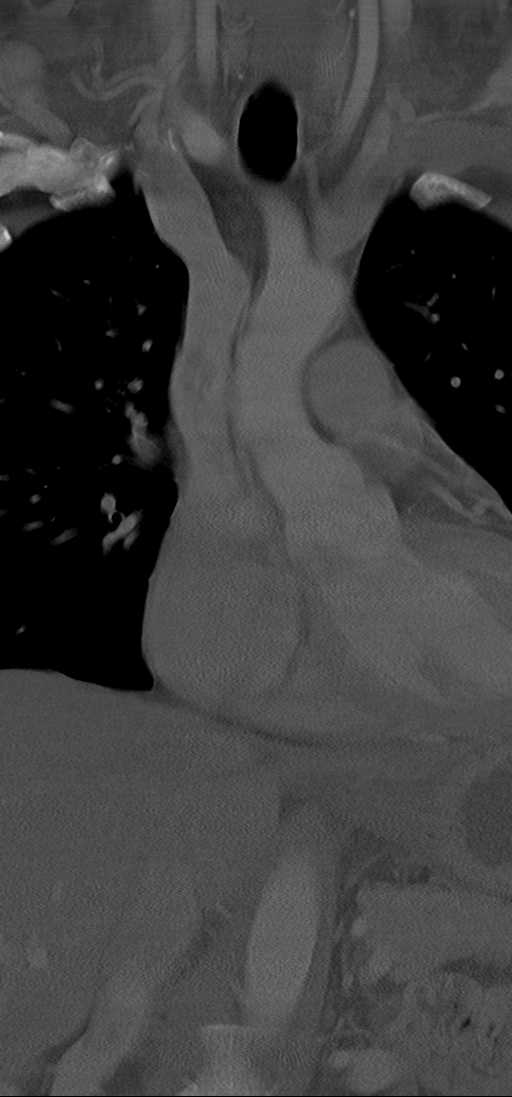
[im 13/31  bone]
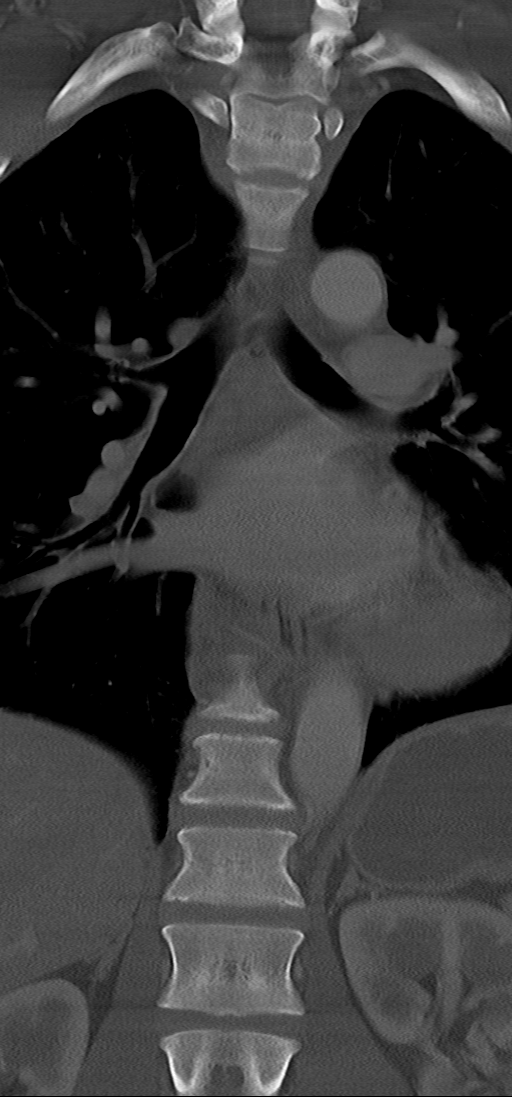
[im 19/31  bone]
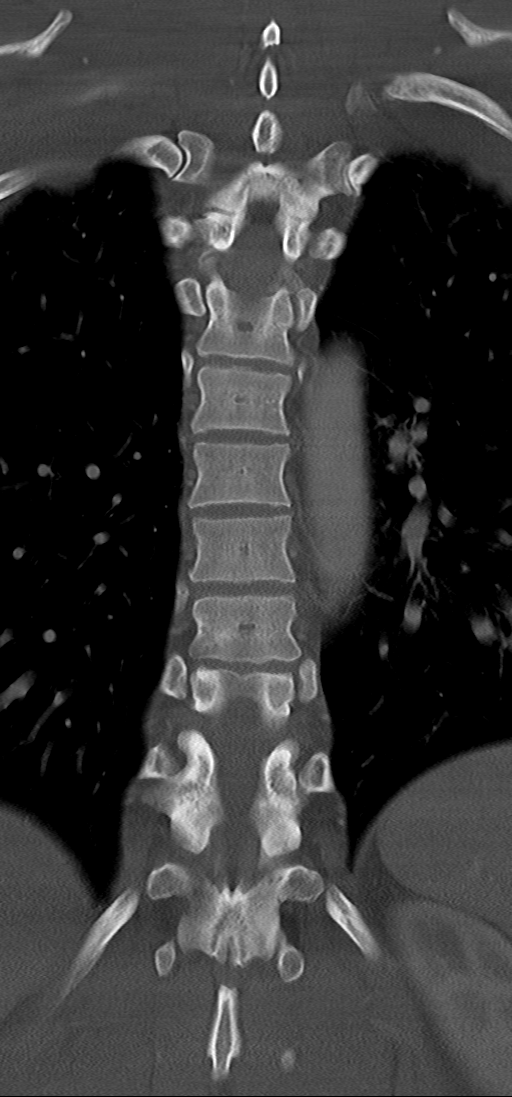

[Series 5: tspine sag bone · sagittal · 0.30mm/px · 5 of 31 slices shown]
[im 11/31  bone]
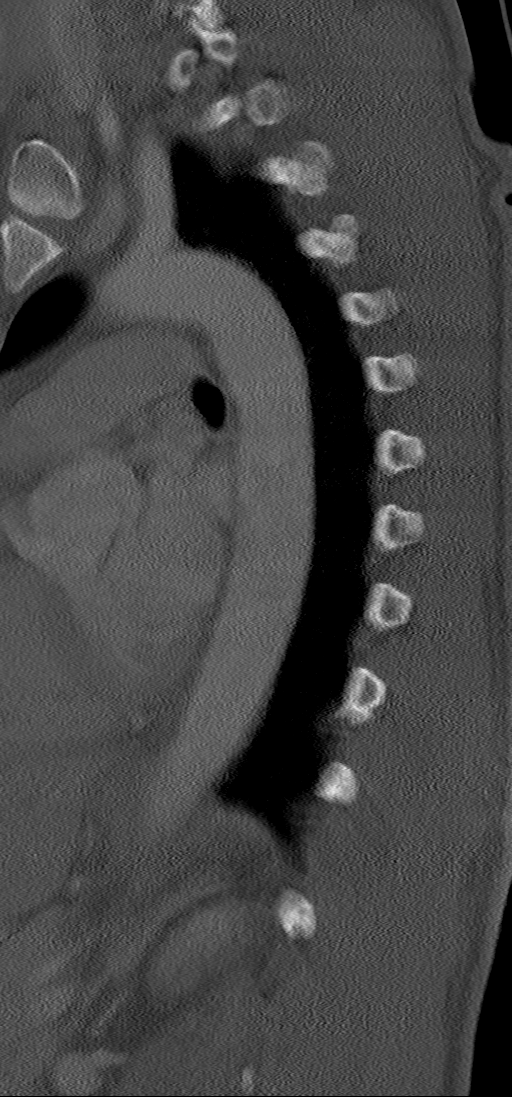
[im 13/31  bone]
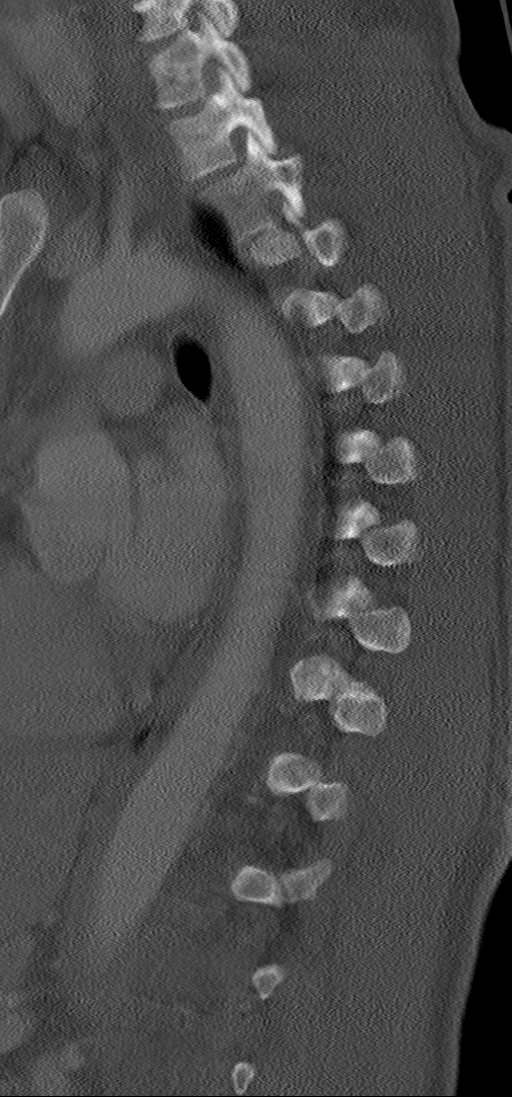
[im 16/31  bone]
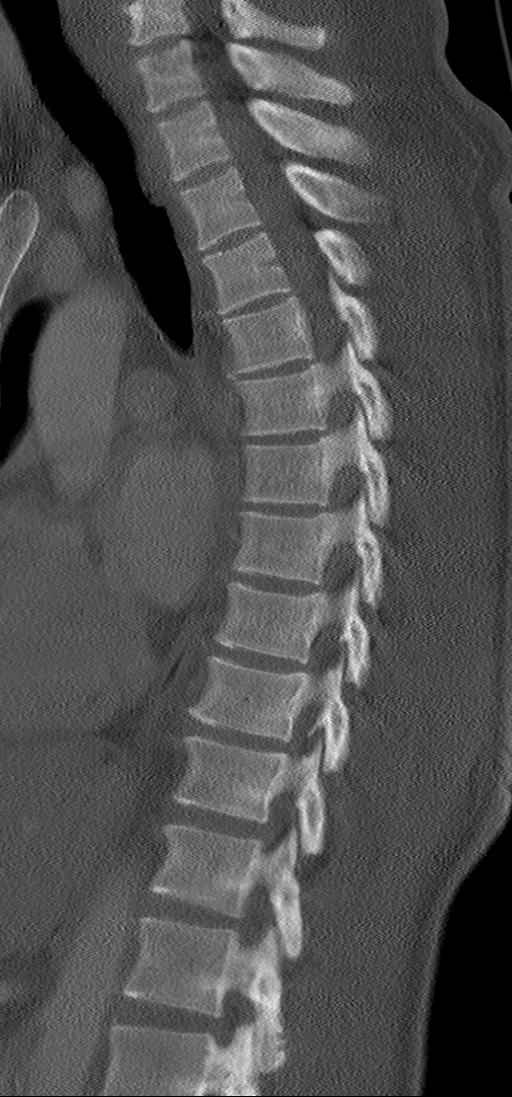
[im 18/31  bone]
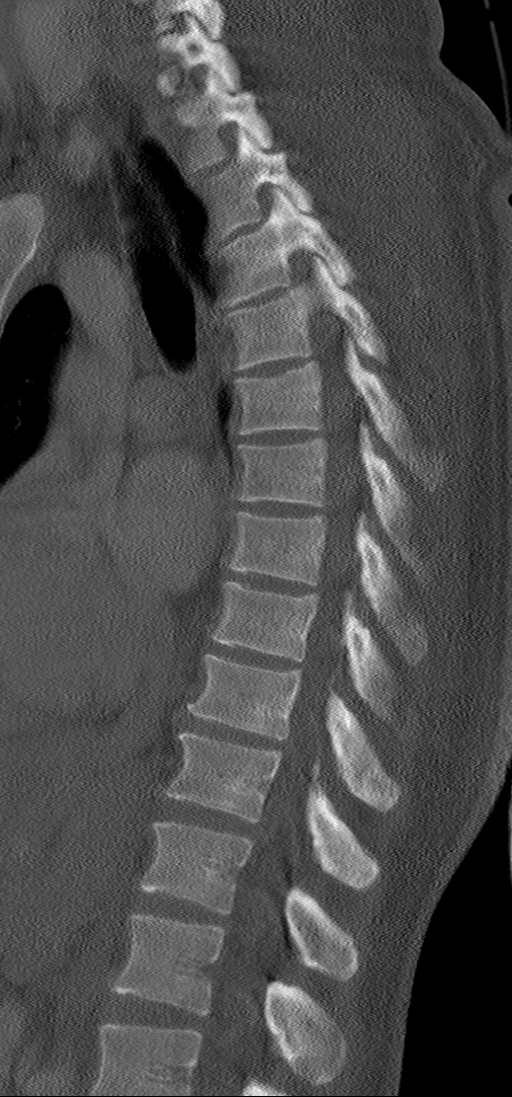
[im 21/31  bone]
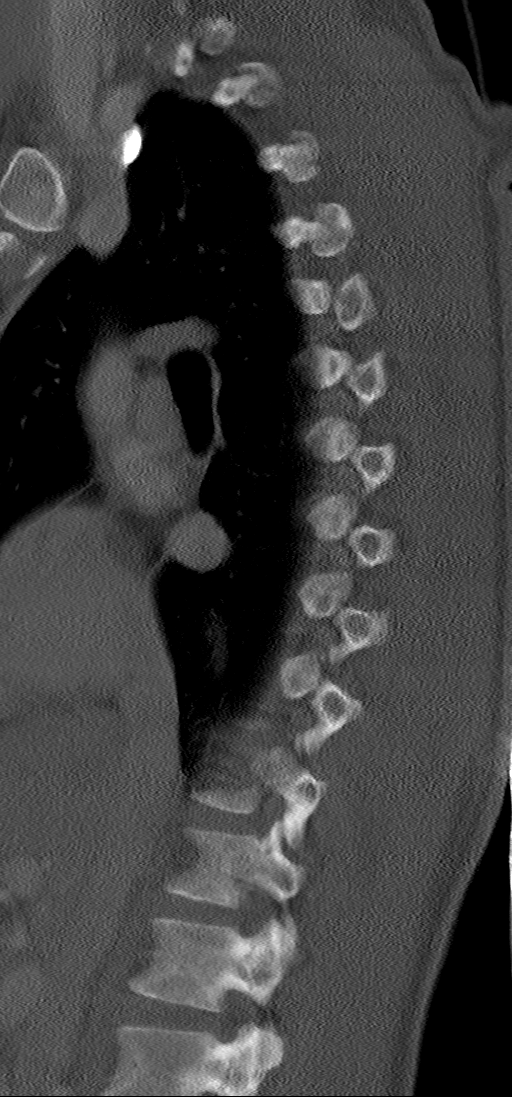

[10 of 33 positions shown; findings below may reference images not displayed]

FINDINGS: CT THORACIC SPINE FINDINGS

Alignment: Normal.

Vertebrae: No acute fracture or focal pathologic process.

Disc levels: Negative

CT LUMBAR SPINE FINDINGS

Segmentation: There is a left assimilation joint at L5-S1

Alignment: Normal

Vertebrae: No acute fracture or focal pathologic process.

Disc levels: Negative
IMPRESSION: No acute fracture or static subluxation of the thoracic or lumbar
spine.

## 2023-01-08 IMAGING — DX DG CHEST 1V PORT
1 series · 1 of 1 positions shown · non-contrast
Comparison: 01/30/2022

CLINICAL DATA: Quadriparesis.

EXAM:
PORTABLE CHEST 1 VIEW

[chest ap]
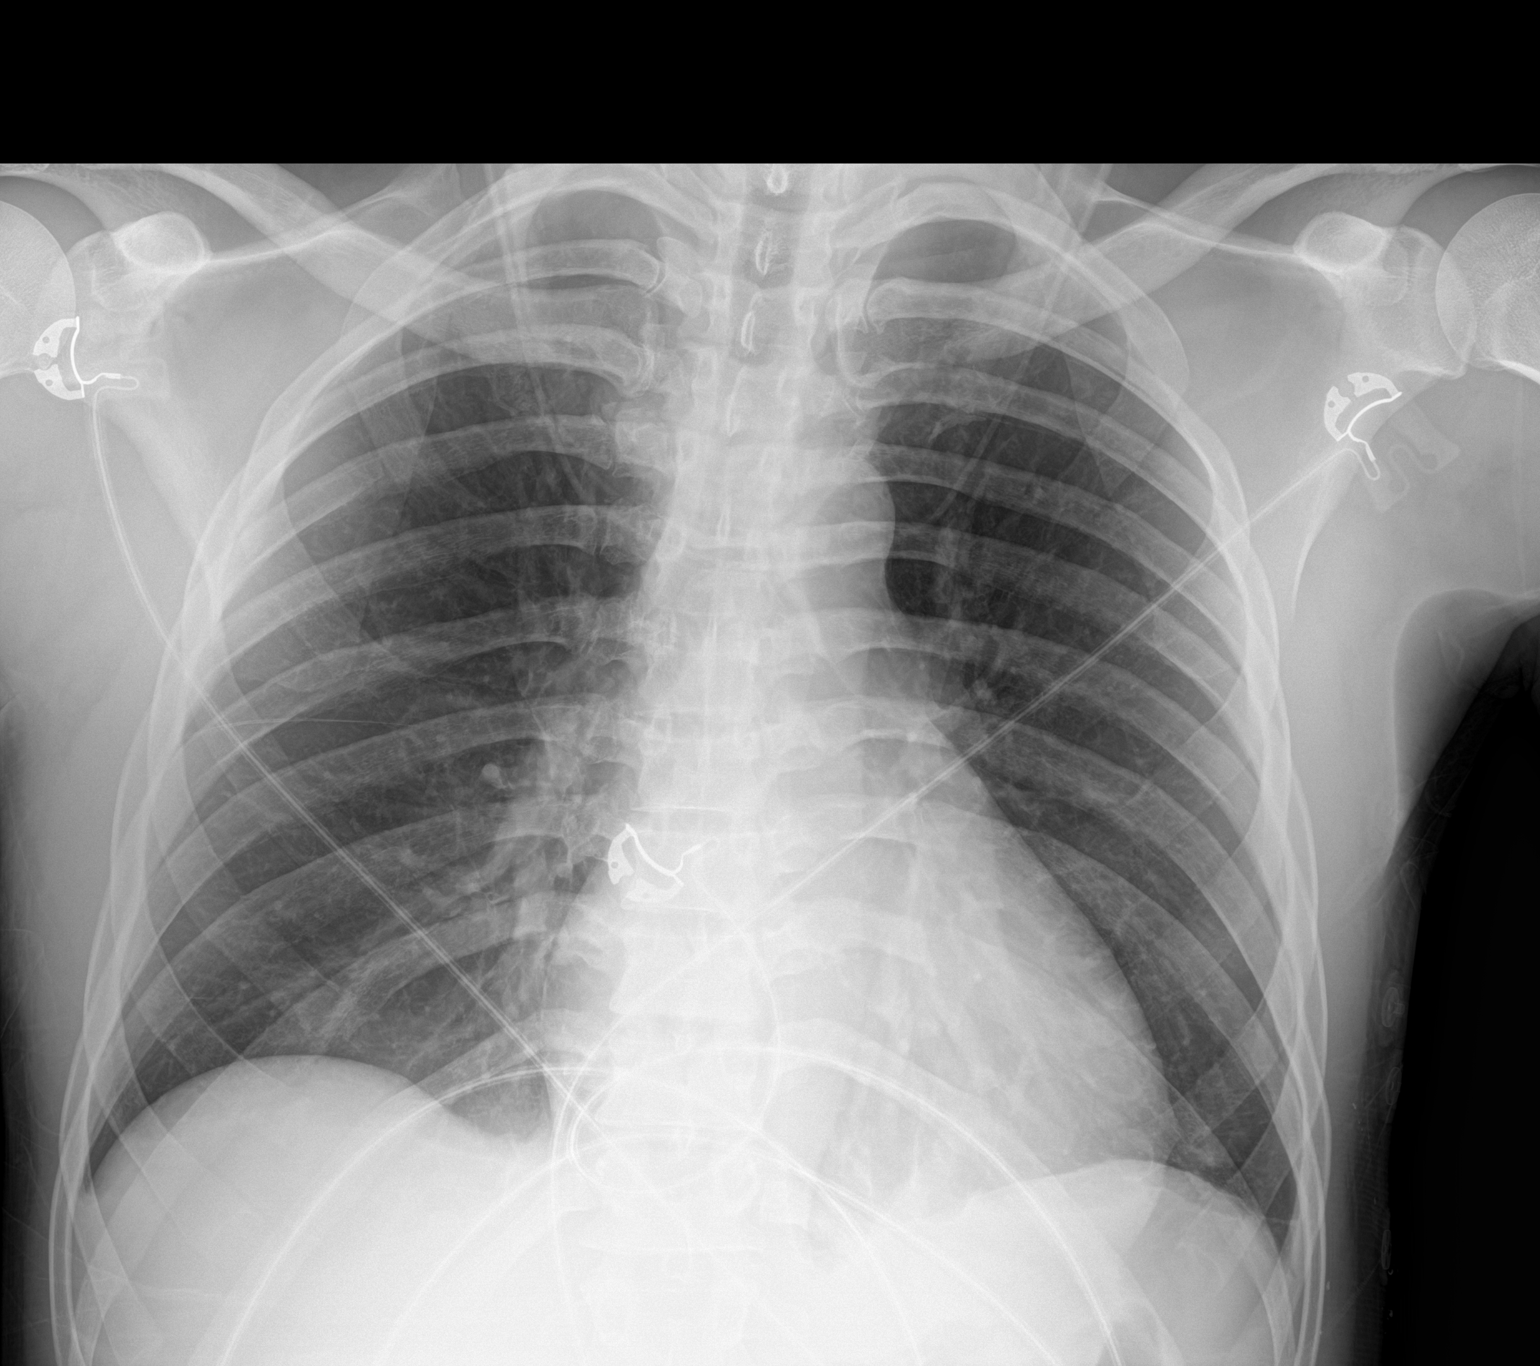

[1 of 1 positions shown; findings below may reference images not displayed]

FINDINGS: No focal consolidation or pulmonary edema. No pneumothorax. Tiny
bilateral pleural effusions evident with some probable atelectasis
in the retrocardiac left base. Telemetry leads overlie the chest.
IMPRESSION: Tiny bilateral pleural effusions.
